# Patient Record
Sex: Male | Born: 1990
Health system: Southern US, Community
[De-identification: ages and names within clinical notes are randomized; demographics above are authoritative.]

## PROBLEM LIST (undated history)

## (undated) DIAGNOSIS — T7840XA Allergy, unspecified, initial encounter: Secondary | ICD-10-CM

## (undated) HISTORY — DX: Allergy, unspecified, initial encounter: T78.40XA

---

## 2012-06-02 ENCOUNTER — Ambulatory Visit (INDEPENDENT_AMBULATORY_CARE_PROVIDER_SITE_OTHER): Payer: BC Managed Care – PPO | Admitting: Family Medicine

## 2012-06-02 DIAGNOSIS — Z23 Encounter for immunization: Secondary | ICD-10-CM

## 2012-12-08 ENCOUNTER — Ambulatory Visit (INDEPENDENT_AMBULATORY_CARE_PROVIDER_SITE_OTHER): Payer: BC Managed Care – PPO | Admitting: Family Medicine

## 2012-12-08 VITALS — BP 94/68 | HR 63 | Temp 97.8°F | Resp 16 | Ht 64.0 in | Wt 116.0 lb

## 2012-12-08 DIAGNOSIS — J302 Other seasonal allergic rhinitis: Secondary | ICD-10-CM

## 2012-12-08 DIAGNOSIS — Z0000 Encounter for general adult medical examination without abnormal findings: Secondary | ICD-10-CM

## 2012-12-08 LAB — POCT CBC
Granulocyte percent: 54.9 %G (ref 37–80)
HCT, POC: 50.2 % (ref 43.5–53.7)
MCHC: 32.3 g/dL (ref 31.8–35.4)
MPV: 7.9 fL (ref 0–99.8)
POC Granulocyte: 3.8 (ref 2–6.9)
POC LYMPH PERCENT: 35.9 %L (ref 10–50)
POC MID %: 9.2 %M (ref 0–12)
Platelet Count, POC: 348 10*3/uL (ref 142–424)
RDW, POC: 13 %

## 2012-12-08 LAB — LIPID PANEL
Cholesterol: 201 mg/dL — ABNORMAL HIGH (ref 0–200)
Triglycerides: 187 mg/dL — ABNORMAL HIGH (ref <150)
VLDL: 37 mg/dL (ref 0–40)

## 2012-12-08 LAB — COMPREHENSIVE METABOLIC PANEL
Albumin: 5 g/dL (ref 3.5–5.2)
BUN: 16 mg/dL (ref 6–23)
CO2: 27 mEq/L (ref 19–32)
Calcium: 10.2 mg/dL (ref 8.4–10.5)
Chloride: 103 mEq/L (ref 96–112)
Glucose, Bld: 90 mg/dL (ref 70–99)
Potassium: 4.8 mEq/L (ref 3.5–5.3)
Sodium: 139 mEq/L (ref 135–145)
Total Protein: 7.8 g/dL (ref 6.0–8.3)

## 2012-12-08 MED ORDER — CETIRIZINE HCL 10 MG PO TABS: 10.0000 mg | ORAL_TABLET | Freq: Every day | ORAL | Status: DC

## 2012-12-08 MED ORDER — FLUTICASONE PROPIONATE 50 MCG/ACT NA SUSP: 2.0000 | Freq: Every day | NASAL | Status: DC

## 2012-12-08 NOTE — Patient Instructions (Signed)

## 2012-12-08 NOTE — Progress Notes (Signed)
  Subjective:    Patient ID: Randy Roberts, male    DOB: Apr 17, 1991, 22 y.o.   MRN: [id number]  HPI  22 yo moved here [place] 2 yrs ago.   Had all his immunizations UTD in 2011 when he got his visa to come here inc MMR.  Review of Systems    BP 94/68  Pulse 63  Temp(Src) 97.8 F (36.6 C) (Oral)  Resp 16  Ht 5\' 4"  (1.626 m)  Wt 116 lb (52.617 kg)  BMI 19.9 kg/m2  SpO2 100% Objective:   Physical Exam        Assessment & Plan:

## 2012-12-13 ENCOUNTER — Telehealth: Payer: Self-pay

## 2012-12-13 NOTE — Telephone Encounter (Signed)
See labs 

## 2012-12-13 NOTE — Telephone Encounter (Signed)
Randy Roberts (mother's husband) calling about lab results. States they received 2 messages, one for the mother and one for her son. They were seen on the same day. Cb# [id number].

## 2013-03-15 ENCOUNTER — Ambulatory Visit (INDEPENDENT_AMBULATORY_CARE_PROVIDER_SITE_OTHER): Payer: BC Managed Care – PPO | Admitting: Family Medicine

## 2013-03-15 VITALS — BP 100/58 | HR 62 | Temp 98.2°F | Resp 16 | Ht 64.0 in | Wt 110.8 lb

## 2013-03-15 DIAGNOSIS — J0100 Acute maxillary sinusitis, unspecified: Secondary | ICD-10-CM

## 2013-03-15 DIAGNOSIS — J069 Acute upper respiratory infection, unspecified: Secondary | ICD-10-CM

## 2013-03-15 DIAGNOSIS — J309 Allergic rhinitis, unspecified: Secondary | ICD-10-CM

## 2013-03-15 DIAGNOSIS — G4700 Insomnia, unspecified: Secondary | ICD-10-CM

## 2013-03-15 MED ORDER — AZITHROMYCIN 250 MG PO TABS: ORAL_TABLET | ORAL | Status: DC

## 2013-03-15 NOTE — Patient Instructions (Addendum)
1.  Restart Zyrtec (one tablet daily) and Flonase nasal spray (2 sprays each nostril daily). 2. Take nighttime cold medication every night. 3.  Start Zpack daily. 4.  Stay out of work on Monday and Tuesday.

## 2013-03-15 NOTE — Progress Notes (Signed)
[address]   [place], [place]  [address]   [phone]  Subjective:    Patient ID: Randy Roberts, male    DOB: 1990/09/05, 22 y.o.   MRN: [id number]  HPI This 22 y.o. male presents for evaluation of sinus congestion.  Flew from [place] to [place] on 03/10/13.  +dizziness.  +nasal congestion.  Bought Dayquil and Nyquil for three days.  Plans to return to work tomorrow.  Did not sleep well due to time change; stayed up all night.  Malaise.  Would like two days off from work.  Low grade fever upon return.  No longer having fever.  +rhinorrhea.  No ear pain.  No sore throat. No cough.  +decreased appetite; no n/v/d.  Took MVI this morning.  Takes two fish oil.  No longer taking Zyrtec or Flonase; took both for one month.    Review of Systems  Constitutional: Negative for fever, chills, diaphoresis and fatigue.  HENT: Positive for congestion, rhinorrhea, sneezing, postnasal drip and sinus pressure. Negative for ear pain, sore throat, trouble swallowing and voice change.   Respiratory: Negative for cough.   Gastrointestinal: Negative for nausea, vomiting, abdominal pain and diarrhea.  Skin: Negative for rash.  Neurological: Negative for dizziness, light-headedness and headaches.   Past Medical History  Diagnosis Date  . Allergy    Current Outpatient Prescriptions on File Prior to Visit  Medication Sig Dispense Refill  . cetirizine (ZYRTEC) 10 MG tablet Take 1 tablet (10 mg total) by mouth daily.  30 tablet  11  . fish oil-omega-3 fatty acids 1000 MG capsule Take 2 g by mouth daily.      . fluticasone (FLONASE) 50 MCG/ACT nasal spray Place 2 sprays into the nose at bedtime.  16 g  6  . Multiple Vitamins-Minerals (MULTIVITAMIN WITH MINERALS) tablet Take 1 tablet by mouth daily.       No current facility-administered medications on file prior to visit.       Objective:   Physical Exam  Nursing note and vitals reviewed. Constitutional: He appears well-developed and well-nourished. No distress.    HENT:  Head: Normocephalic and atraumatic.  Right Ear: External ear normal.  Left Ear: External ear normal.  Nose: Mucosal edema and rhinorrhea present. Right sinus exhibits maxillary sinus tenderness. Right sinus exhibits no frontal sinus tenderness. Left sinus exhibits maxillary sinus tenderness. Left sinus exhibits no frontal sinus tenderness.  Mouth/Throat: Oropharynx is clear and moist.  Eyes: Conjunctivae are normal. Pupils are equal, round, and reactive to light.  Neck: Normal range of motion. Neck supple.  Cardiovascular: Normal rate, regular rhythm and normal heart sounds.   Pulmonary/Chest: Effort normal and breath sounds normal.  Lymphadenopathy:    He has no cervical adenopathy.  Skin: Skin is warm and dry. No rash noted. He is not diaphoretic.  Psychiatric: He has a normal mood and affect. His behavior is normal.       Assessment & Plan:  Acute upper respiratory infections of unspecified site  Allergic rhinitis, cause unspecified  Sinusitis, acute maxillary - Plan: azithromycin (ZITHROMAX) 250 MG tablet  Insomnia   1.  Allergic Rhinitis:  New/worsening; versus URI.  Restart Zyrtec and Flonase; continue Nyquil qhs.   2.  Acute sinusitis maxillary: New.  Rx for Zpack provided. 3.  Insomnia: New. Due to recent return from [place]; OOW note for two days.  Meds ordered this encounter  Medications  . azithromycin (ZITHROMAX) 250 MG tablet    Sig: Take 2 tabs PO x  1 dose, then 1 tab PO QD x 4 days    Dispense:  6 tablet    Refill:  0

## 2013-05-26 ENCOUNTER — Ambulatory Visit (INDEPENDENT_AMBULATORY_CARE_PROVIDER_SITE_OTHER): Payer: BC Managed Care – PPO | Admitting: Family Medicine

## 2013-05-26 DIAGNOSIS — Z23 Encounter for immunization: Secondary | ICD-10-CM

## 2014-01-19 ENCOUNTER — Ambulatory Visit (INDEPENDENT_AMBULATORY_CARE_PROVIDER_SITE_OTHER): Payer: BC Managed Care – PPO | Admitting: Family Medicine

## 2014-01-19 VITALS — BP 122/86 | HR 65 | Temp 98.4°F | Resp 18 | Ht 63.0 in | Wt 113.2 lb

## 2014-01-19 DIAGNOSIS — Z0000 Encounter for general adult medical examination without abnormal findings: Secondary | ICD-10-CM

## 2014-01-19 NOTE — Progress Notes (Signed)
Subjective:    Patient ID: Randy Roberts, male    DOB: [birth year], 23 y.o.   MRN: [id number]  HPI Randy Roberts is a 23 y.o. male Here for annual exam.   immunizations UTD in 2011 when he got his visa to come here including MMR. No specific concerns today.   No rx meds, no medical problems. Prior allergy medicine - not needed now.   No hospitalizations. Last dentist visit six months ago. Last eye visit was a year ago. His wife lives in [place]. No other physical relations. No risk of STI. No concerns with his body. No rash. Mild acne. Pt agrees he does not need blood work this year.    SH: nonsmoker, no alcohol.      Last CPE in 11/2012: Results for orders placed in visit on 12/08/12  LIPID PANEL      Result Value Ref Range   Cholesterol 201 (*) 0 - 200 mg/dL   Triglycerides 187 (*) <150 mg/dL   HDL 48  >39 mg/dL   Total CHOL/HDL Ratio 4.2     VLDL 37  0 - 40 mg/dL   LDL Cholesterol 116 (*) 0 - 99 mg/dL  TSH      Result Value Ref Range   TSH 1.096  0.350 - 4.500 uIU/mL  COMPREHENSIVE METABOLIC PANEL      Result Value Ref Range   Sodium 139  135 - 145 mEq/L   Potassium 4.8  3.5 - 5.3 mEq/L   Chloride 103  96 - 112 mEq/L   CO2 27  19 - 32 mEq/L   Glucose, Bld 90  70 - 99 mg/dL   BUN 16  6 - 23 mg/dL   Creat 0.91  0.50 - 1.35 mg/dL   Total Bilirubin 0.8  0.3 - 1.2 mg/dL   Alkaline Phosphatase 61  39 - 117 U/L   AST 18  0 - 37 U/L   ALT 34  0 - 53 U/L   Total Protein 7.8  6.0 - 8.3 g/dL   Albumin 5.0  3.5 - 5.2 g/dL   Calcium 10.2  8.4 - 10.5 mg/dL  POCT CBC      Result Value Ref Range   WBC 6.9  4.6 - 10.2 K/uL   Lymph, poc 2.5  0.6 - 3.4   POC LYMPH PERCENT 35.9  10 - 50 %L   MID (cbc) 0.6  0 - 0.9   POC MID % 9.2  0 - 12 %M   POC Granulocyte 3.8  2 - 6.9   Granulocyte percent 54.9  37 - 80 %G   RBC 5.54  4.69 - 6.13 M/uL   Hemoglobin 16.2  14.1 - 18.1 g/dL   HCT, POC 50.2  43.5 - 53.7 %   MCV 90.7  80 - 97 fL   MCH, POC 29.2  27 - 31.2 pg   MCHC 32.3  31.8 -  35.4 g/dL   RDW, POC 13.0     Platelet Count, POC 348  142 - 424 K/uL   MPV 7.9  0 - 99.8 fL     There are no active problems to display for this patient.  Past Medical History  Diagnosis Date  . Allergy    History reviewed. No pertinent past surgical history. Allergies  Allergen Reactions  . Penicillins Itching  . Tetracyclines & Related Itching   Prior to Admission medications   Medication Sig Start Date End Date Taking? Authorizing Provider  Multiple Vitamins-Minerals (MULTIVITAMIN  WITH MINERALS) tablet Take 1 tablet by mouth daily.   Yes Historical Provider, MD  azithromycin (ZITHROMAX) 250 MG tablet Take 2 tabs PO x 1 dose, then 1 tab PO QD x 4 days 03/15/13   Wardell Honour, MD  cetirizine (ZYRTEC) 10 MG tablet Take 1 tablet (10 mg total) by mouth daily. 12/08/12   Shawnee Knapp, MD  fish oil-omega-3 fatty acids 1000 MG capsule Take 2 g by mouth daily.    Historical Provider, MD  fluticasone (FLONASE) 50 MCG/ACT nasal spray Place 2 sprays into the nose at bedtime. 12/08/12   Shawnee Knapp, MD   History   Social History  . Marital Status: Single    Spouse Name: N/A    Number of Children: N/A  . Years of Education: N/A   Occupational History  . Not on file.   Social History Main Topics  . Smoking status: Never Smoker   . Smokeless tobacco: Never Used  . Alcohol Use: No  . Drug Use: No  . Sexual Activity: No   Other Topics Concern  . Not on file   Social History Narrative  . No narrative on file       Review of Systems 13 point review of systems per patient health survey noted.  Negative other than as indicated above.      Objective:   Physical Exam  Vitals reviewed. Constitutional: He is oriented to person, place, and time. He appears well-developed and well-nourished.  HENT:  Head: Normocephalic and atraumatic.  Right Ear: External ear normal.  Left Ear: External ear normal.  Mouth/Throat: Oropharynx is clear and moist.  Eyes: Conjunctivae and EOM are  normal. Pupils are equal, round, and reactive to light.  Neck: Normal range of motion. Neck supple. No thyromegaly present.  Cardiovascular: Normal rate, regular rhythm, normal heart sounds and intact distal pulses.   Pulmonary/Chest: Effort normal and breath sounds normal. No respiratory distress. He has no wheezes.  Abdominal: Soft. He exhibits no distension. There is no tenderness.  Musculoskeletal: Normal range of motion. He exhibits no edema and no tenderness.  Lymphadenopathy:    He has no cervical adenopathy.  Neurological: He is alert and oriented to person, place, and time. He has normal reflexes.  Skin: Skin is warm and dry.     Psychiatric: He has a normal mood and affect. His behavior is normal.   Filed Vitals:   01/19/14 0857  BP: 122/86  Pulse: 65  Temp: 98.4 F (36.9 C)  TempSrc: Oral  Resp: 18  Height: _0  (1.6 m)  Weight: 113 lb 3.2 oz (51.347 kg)  SpO2: 100%    Visual Acuity Screening   Right eye Left eye Both eyes  Without correction:     With correction: 20/25-1 2025-1 20/20-1      Assessment & Plan:  Randy Roberts is a 23 y.o. male No diagnosis found.  Annual exam, no concerns identified or high risk behaviors. Few areas of acne L forehead, handout given, discussed otc treatments. rtc precautions.   Discussed no need for bloodwork today given age and only mildly elevated chol last year.    No orders of the defined types were placed in this encounter.   Patient Instructions  For bumps on face (Acne) try Cetaphil cleanser or Neutrogena cleanser each day (cleanser with benzoyl peroxide can help with acne). See other information below.  No blood tests needed today, return if you have any concerns or questions.   M?n tr?ng  c (Acne) M?n tr?ng c l v?n ?? v? da gy n?i m?n. M?n tr?ng c xu?t hi?n khi cc l? chn lng trn da b? t?c. L? chn lng c th? tr? nn t?y ??, ?au v s?ng (vim) ho?c b? nhi?m m?t lo?i vi khu?n da ph? bi?n (Propionibacterium  acnes). M?n tr?ng c l v?n ?? ph? bi?n ? da. C ??n 80% ng??i b? m?n tr?ng c t?i m?t th?i ?i?m no ?Marland Kitchen M?n tr?ng c ??c bi?t ph? bi?n ? ?? tu?i t? 10 ??n 22. M?n tr?ng c th??ng h?t theo th?i gian nh? ?i?u tr? thch h?p.  NGUYN NHN L? chn lng c ch?a tuy?n b nh?n. Tuy?n b nh?n t?o ra ch?t nh?n ???c g?i l b nh?n.M?n tr?ng c x?y ra khi cc tuy?n ny b? t?c do b nh?n, cc t? bo da ch?t v b?i b?n. Vi khu?n P.acnes c th??ng ???c tm th?y trong tuy?n b nh?n sau ? sinh si n?y n?, gy ra vim. M?n tr?ng c th??ng b?t ??u b?i nh?ng thay ??i hocmon. Nh?ng thay ??i hocmon ny c th? lm cho tuy?n b nh?n tr? nn to h?n v t?o ra nhi?u b nh?n h?n. Cc y?u t? c th? lm cho m?n tr?ng c tr?m tr?ng h?n bao g?m:  Thay ??i hocmon trong th?i k thanh nin.  Thay ??i hc-mn trong chu k? kinh nguy?t c?a ph? n?.  Thay ??i hocmon trong th?i k mang [place].  M? ph?m v s?n ph?m tc c d?u.  Ch xt da qu m?nh.  X phng m?nh.  C?ng th?ng.  V?n ?? hc-mn do m?t s? b?nh nh?t ??nh.  Tc di ho?c c nhi?u d?u c? vo da.  M?t s? lo?i thu?c nh?t ??nh.  p l?c t? b?ng bu?c ??u, ba l, ho?c ??m vai.  Ti?p xc v?i m?t s? lo?i d?u v ha ch?t. TRI?U CH?NG M?n tr?ng c th??ng xu?t hi?n trn m?t, c?, ng?c v ph?n l?ng pha trn. Cc tri?u ch?ng bao g?m:  U nh?, mu ?? (n?i m?n ho?c n?t s?n).  M?n ??u tr?ng (m?n tr?ng c b?c).  M?n ??u ?en (m?n tr?ng c m?).  M?n nh?, ch?a ??y m? (m?n m?).  M?n to, mu ?? ho?c m?n m? s? vo th?y m?m. M?n tr?ng c n?ng h?n c th? gy ra:  Vng b? nhi?m b?nh c ch?a m?t l??ng m? (p xe).  B?ng c?ng, ?au, ch?a ??y d?ch (nang).  S?o. CH?N ?ON Chuyn gia ch?m [place] s?c kh?e c?a b?n th??ng c th? xc ??nh v?n ?? b?ng cch khm th?c th?. ?I?U TR? C nhi?u ph??ng php ?i?u tr? t?t cho m?n tr?ng c. M?t s? lo?i c th? s? d?ng ???c d??i d?ng khng c?n k ??n, m?t s? c th? s? d?ng ???c d??i d?ng c?n ph?i k ??n. Cch ?i?u tr? t?t nh?t cho b?n ph? thu?c vo  lo?i m?n tr?ng c b?n b? v m?c ?? nghim tr?ng c?a n. Vi?c ?i?u tr? c th? m?t 2 thng tr??c khi b?nh m?n tr?ng c thuyn gi?m. Cc ph??ng php ?i?u tr? thng d?ng bao g?m:  Kem v dung d?ch ng?n ng?a t?c tuy?n b nh?n.  Kem v dung d?ch ?i?u tr? ho?c ng?n ng?a nhi?m trng v vim.  [place] ho?c dng d??i d?ng vin.  Thu?c gi?m s? s?n sinh b nh?n.  Thu?c trnh [place].  ?i?u tr? b?ng ?n chi?u sng ho?c laze ??c bi?t.  Ti?u ph?u.  Tim thu?c vo nh?ng vng m?n tr?ng  c.  Ha ch?t lm [place]. H??NG D?N CH?M [place] T?I NH Ch?m [place] da t?t l ph?n quan tr?ng nh?t c?a qu trnh ?i?u tr?Marland Kitchen  R?a da nh? nhng t nh?t hai l?n m?i ngy v sau khi t?p th? d?c. [place]??c khi ?i ng?.  S? d?ng x phng nh?Trina Ao kem gi? ?m c n??c sau m?i l?n r?a.  Gi? tc s?ch v khng ?? tc c? ln m?t. G?i ??u hng ngy.  Ch? s? d?ng thu?c theo ch? d?n c?a chuyn gia ch?m [place] s?c kh?e.  S? d?ng kem ho?c dung d?ch ch?ng n?ng v?i ch? s? SPF 56 ho?c cao h?n. ?i?u ny ??c bi?t quan tr?ng khi b?n s? d?ng thu?c tr? m?n tr?ng c.  Ch?n m? ph?m khng lm t?c l? chn lng. ?i?u ny c ngh?a l chng khng lm t?c tuy?n b nh?n.  Trnh ch?ng tay ln c?m ho?c trn.  Trnh ?eo b?ng bu?c ??u ho?c ??i m? ch?t.  Trnh bp ho?c n?n m?n. ?i?u ny c th? lm cho tnh tr?ng m?n tr?m tr?ng h?n v gy ra s?o. HY ?I KHM N?U:  M?n tr?ng c khng thuyn gi?m sau 8 tu?n.  M?n tr?ng c tr? nn t?i t? h?n.  M?t vng l?n c?a da t?y ?? ho?c nh?y c?m ?au. Document Released: 07/30/2005 Document Revised: 04/01/2013 [facility] Patient Information 2014 [place], [place].   Keeping you healthy  Get these tests  Blood pressure- Have your blood pressure checked once a year by your healthcare provider.  Normal blood pressure is 120/80.  Weight- Have your body mass index (BMI) calculated to screen for obesity.  BMI is a measure of body fat based on height and weight. You can also calculate your own BMI at  GravelBags.it.  Cholesterol- Have your cholesterol checked regularly starting at age 46, sooner may be necessary if you have diabetes, high blood pressure, if a family member developed heart diseases at an early age or if you smoke.   Chlamydia, HIV, and other sexual transmitted disease- Get screened each year until the age of 53 then within three months of each new sexual partner.  Diabetes- Have your blood sugar checked regularly if you have high blood pressure, high cholesterol, a family history of diabetes or if you are overweight.  Get these vaccines  Flu shot- Every fall.  Tetanus shot- Every 10 years.  Menactra- Single dose; prevents meningitis.  Take these steps  Don't smoke- If you do smoke, ask your healthcare provider about quitting. For tips on how to quit, go to www.smokefree.gov or call 1-800-QUIT-NOW.  Be physically active- Exercise 5 days a week for at least 30 minutes.  If you are not already physically active start slow and gradually work up to 30 minutes of moderate physical activity.  Examples of moderate activity include walking briskly, mowing the yard, dancing, swimming bicycling, etc.  Eat a healthy diet- Eat a variety of healthy foods such as fruits, vegetables, low fat milk, low fat cheese, yogurt, lean meats, poultry, fish, beans, tofu, etc.  For more information on healthy eating, go to www.thenutritionsource.org  Drink alcohol in moderation- Limit alcohol intake two drinks or less a day.  Never drink and drive.  Dentist- Brush and floss teeth twice daily; visit your dentis twice a year.  Depression-Your emotional health is as important as your physical health.  If you're feeling down, losing interest in things you normally enjoy please talk with your healthcare provider.  Gun Safety- If  you keep a gun in your home, keep it unloaded and with the safety lock on.  Bullets should be stored separately.  Helmet use- Always wear a helmet when riding a  motorcycle, bicycle, rollerblading or skateboarding.  Safe sex- If you may be exposed to a sexually transmitted infection, use a condom  Seat belts- Seat bels can save your life; always wear one.  Smoke/Carbon Monoxide detectors- These detectors need to be installed on the appropriate level of your home.  Replace batteries at least once a year.  Skin Cancer- When out in the sun, cover up and use sunscreen SPF 15 or higher.  Violence- If anyone is threatening or hurting you, please tell your healthcare provider.

## 2014-01-19 NOTE — Patient Instructions (Signed)
For bumps on face (Acne) try Cetaphil cleanser or Neutrogena cleanser each day (cleanser with benzoyl peroxide can help with acne). See other information below.  No blood tests needed today, return if you have any concerns or questions.   M?n tr?ng c (Acne) M?n tr?ng c l v?n ?? v? da gy n?i m?n. M?n tr?ng c xu?t hi?n khi cc l? chn lng trn da b? t?c. L? chn lng c th? tr? nn t?y ??, ?au v s?ng (vim) ho?c b? nhi?m m?t lo?i vi khu?n da ph? bi?n (Propionibacterium acnes). M?n tr?ng c l v?n ?? ph? bi?n ? da. C ??n 80% ng??i b? m?n tr?ng c t?i m?t th?i ?i?m no ?Marland Kitchen M?n tr?ng c ??c bi?t ph? bi?n ? ?? tu?i t? 10 ??n 22. M?n tr?ng c th??ng h?t theo th?i gian nh? ?i?u tr? thch h?p.  NGUYN NHN L? chn lng c ch?a tuy?n b nh?n. Tuy?n b nh?n t?o ra ch?t nh?n ???c g?i l b nh?n.M?n tr?ng c x?y ra khi cc tuy?n ny b? t?c do b nh?n, cc t? bo da ch?t v b?i b?n. Vi khu?n P.acnes c th??ng ???c tm th?y trong tuy?n b nh?n sau ? sinh si n?y n?, gy ra vim. M?n tr?ng c th??ng b?t ??u b?i nh?ng thay ??i hocmon. Nh?ng thay ??i hocmon ny c th? lm cho tuy?n b nh?n tr? nn to h?n v t?o ra nhi?u b nh?n h?n. Cc y?u t? c th? lm cho m?n tr?ng c tr?m tr?ng h?n bao g?m:  Thay ??i hocmon trong th?i k thanh nin.  Thay ??i hc-mn trong chu k? kinh nguy?t c?a ph? n?.  Thay ??i hocmon trong th?i k mang [place].  M? ph?m v s?n ph?m tc c d?u.  Ch xt da qu m?nh.  X phng m?nh.  C?ng th?ng.  V?n ?? hc-mn do m?t s? b?nh nh?t ??nh.  Tc di ho?c c nhi?u d?u c? vo da.  M?t s? lo?i thu?c nh?t ??nh.  p l?c t? b?ng bu?c ??u, ba l, ho?c ??m vai.  Ti?p xc v?i m?t s? lo?i d?u v ha ch?t. TRI?U CH?NG M?n tr?ng c th??ng xu?t hi?n trn m?t, c?, ng?c v ph?n l?ng pha trn. Cc tri?u ch?ng bao g?m:  U nh?, mu ?? (n?i m?n ho?c n?t s?n).  M?n ??u tr?ng (m?n tr?ng c b?c).  M?n ??u ?en (m?n tr?ng c m?).  M?n nh?, ch?a ??y m? (m?n m?).  M?n to, mu ?? ho?c m?n m?  s? vo th?y m?m. M?n tr?ng c n?ng h?n c th? gy ra:  Vng b? nhi?m b?nh c ch?a m?t l??ng m? (p xe).  B?ng c?ng, ?au, ch?a ??y d?ch (nang).  S?o. CH?N ?ON Chuyn gia ch?m [place] s?c kh?e c?a b?n th??ng c th? xc ??nh v?n ?? b?ng cch khm th?c th?. ?I?U TR? C nhi?u ph??ng php ?i?u tr? t?t cho m?n tr?ng c. M?t s? lo?i c th? s? d?ng ???c d??i d?ng khng c?n k ??n, m?t s? c th? s? d?ng ???c d??i d?ng c?n ph?i k ??n. Cch ?i?u tr? t?t nh?t cho b?n ph? thu?c vo lo?i m?n tr?ng c b?n b? v m?c ?? nghim tr?ng c?a n. Vi?c ?i?u tr? c th? m?t 2 thng tr??c khi b?nh m?n tr?ng c thuyn gi?m. Cc ph??ng php ?i?u tr? thng d?ng bao g?m:  Kem v dung d?ch ng?n ng?a t?c tuy?n b nh?n.  Kem v dung d?ch ?i?u tr? ho?c ng?n ng?a nhi?m trng v vim.  Khng sinh  thoa ln da ho?c dng d??i d?ng vin.  Thu?c gi?m s? s?n sinh b nh?n.  Thu?c trnh [place].  ?i?u tr? b?ng ?n chi?u sng ho?c laze ??c bi?t.  Ti?u ph?u.  Tim thu?c vo nh?ng vng m?n tr?ng c.  Ha ch?t lm l?t da. H??NG D?N CH?M [place] T?I NH Ch?m [place] da t?t l ph?n quan tr?ng nh?t c?a qu trnh ?i?u tr?Marland Kitchen.  R?a da nh? nhng t nh?t hai l?n m?i ngy v sau khi t?p th? d?c. Lun r?a da tr??c khi ?i ng?.  S? d?ng x phng nh?Chad Cordial.  Thoa kem gi? ?m c n??c sau m?i l?n r?a.  Gi? tc s?ch v khng ?? tc c? ln m?t. G?i ??u hng ngy.  Ch? s? d?ng thu?c theo ch? d?n c?a chuyn gia ch?m [place] s?c kh?e.  S? d?ng kem ho?c dung d?ch ch?ng n?ng v?i ch? s? SPF 30 ho?c cao h?n. ?i?u ny ??c bi?t quan tr?ng khi b?n s? d?ng thu?c tr? m?n tr?ng c.  Ch?n m? ph?m khng lm t?c l? chn lng. ?i?u ny c ngh?a l chng khng lm t?c tuy?n b nh?n.  Trnh ch?ng tay ln c?m ho?c trn.  Trnh ?eo b?ng bu?c ??u ho?c ??i m? ch?t.  Trnh bp ho?c n?n m?n. ?i?u ny c th? lm cho tnh tr?ng m?n tr?m tr?ng h?n v gy ra s?o. HY ?I KHM N?U:  M?n tr?ng c khng thuyn gi?m sau 8 tu?n.  M?n tr?ng c tr? nn t?i t? h?n.  M?t vng l?n c?a da  t?y ?? ho?c nh?y c?m ?au. Document Released: 07/30/2005 Document Revised: 04/01/2013 [facility] Patient Information 2014 [place].   Keeping you healthy  Get these tests  Blood pressure- Have your blood pressure checked once a year by your healthcare provider.  Normal blood pressure is 120/80.  Weight- Have your body mass index (BMI) calculated to screen for obesity.  BMI is a measure of body fat based on height and weight. You can also calculate your own BMI at https://www.west-esparza.com/www.nhlbisupport.com/bmi/.  Cholesterol- Have your cholesterol checked regularly starting at age 25, sooner may be necessary if you have diabetes, high blood pressure, if a family member developed heart diseases at an early age or if you smoke.   Chlamydia, HIV, and other sexual transmitted disease- Get screened each year until the age of 825 then within three months of each new sexual partner.  Diabetes- Have your blood sugar checked regularly if you have high blood pressure, high cholesterol, a family history of diabetes or if you are overweight.  Get these vaccines  Flu shot- Every fall.  Tetanus shot- Every 10 years.  Menactra- Single dose; prevents meningitis.  Take these steps  Don't smoke- If you do smoke, ask your healthcare provider about quitting. For tips on how to quit, go to www.smokefree.gov or call 1-800-QUIT-NOW.  Be physically active- Exercise 5 days a week for at least 30 minutes.  If you are not already physically active start slow and gradually work up to 30 minutes of moderate physical activity.  Examples of moderate activity include walking briskly, mowing the yard, dancing, swimming bicycling, etc.  Eat a healthy diet- Eat a variety of healthy foods such as fruits, vegetables, low fat milk, low fat cheese, yogurt, lean meats, poultry, fish, beans, tofu, etc.  For more information on healthy eating, go to www.thenutritionsource.org  Drink alcohol in moderation- Limit alcohol intake two drinks or  less a day.  Never drink and drive.  Dentist- Brush and  floss teeth twice daily; visit your dentis twice a year.  Depression-Your emotional health is as important as your physical health.  If you're feeling down, losing interest in things you normally enjoy please talk with your healthcare provider.  Gun Safety- If you keep a gun in your home, keep it unloaded and with the safety lock on.  Bullets should be stored separately.  Helmet use- Always wear a helmet when riding a motorcycle, bicycle, rollerblading or skateboarding.  Safe sex- If you may be exposed to a sexually transmitted infection, use a condom  Seat belts- Seat bels can save your life; always wear one.  Smoke/Carbon Monoxide detectors- These detectors need to be installed on the appropriate level of your home.  Replace batteries at least once a year.  Skin Cancer- When out in the sun, cover up and use sunscreen SPF 15 or higher.  Violence- If anyone is threatening or hurting you, please tell your healthcare provider.

## 2014-06-01 ENCOUNTER — Ambulatory Visit (INDEPENDENT_AMBULATORY_CARE_PROVIDER_SITE_OTHER): Payer: BC Managed Care – PPO | Admitting: Radiology

## 2014-06-01 DIAGNOSIS — Z23 Encounter for immunization: Secondary | ICD-10-CM

## 2015-03-28 ENCOUNTER — Encounter (HOSPITAL_COMMUNITY): Payer: Self-pay

## 2015-03-28 ENCOUNTER — Emergency Department (HOSPITAL_COMMUNITY)
Admission: EM | Admit: 2015-03-28 | Discharge: 2015-03-28 | Disposition: A | Discharge status: Home / Self Care | Payer: BLUE CROSS/BLUE SHIELD | Attending: Emergency Medicine | Admitting: Emergency Medicine

## 2015-03-28 DIAGNOSIS — Z79899 Other long term (current) drug therapy: Secondary | ICD-10-CM | POA: Insufficient documentation

## 2015-03-28 DIAGNOSIS — Y998 Other external cause status: Secondary | ICD-10-CM | POA: Diagnosis not present

## 2015-03-28 DIAGNOSIS — Y92410 Unspecified street and highway as the place of occurrence of the external cause: Secondary | ICD-10-CM | POA: Diagnosis not present

## 2015-03-28 DIAGNOSIS — R110 Nausea: Secondary | ICD-10-CM | POA: Diagnosis not present

## 2015-03-28 DIAGNOSIS — R42 Dizziness and giddiness: Secondary | ICD-10-CM | POA: Insufficient documentation

## 2015-03-28 DIAGNOSIS — Z7951 Long term (current) use of inhaled steroids: Secondary | ICD-10-CM | POA: Insufficient documentation

## 2015-03-28 DIAGNOSIS — Y9389 Activity, other specified: Secondary | ICD-10-CM | POA: Insufficient documentation

## 2015-03-28 DIAGNOSIS — Z880 Allergy status to penicillin: Secondary | ICD-10-CM | POA: Diagnosis not present

## 2015-03-28 DIAGNOSIS — Z041 Encounter for examination and observation following transport accident: Secondary | ICD-10-CM | POA: Insufficient documentation

## 2015-03-28 MED ORDER — MECLIZINE HCL 25 MG PO TABS
25.0000 mg | ORAL_TABLET | Freq: Once | ORAL | Status: AC
  Administered 2015-03-28: 25 mg via ORAL
  Filled 2015-03-28: qty 1

## 2015-03-28 MED ORDER — MECLIZINE HCL 25 MG PO TABS: 25.0000 mg | ORAL_TABLET | Freq: Three times a day (TID) | ORAL | Status: DC | PRN

## 2015-03-28 NOTE — Discharge Instructions (Signed)
1. Medications: Meclizine, usual home medications 2. Treatment: rest, drink plenty of fluids,  3. Follow Up: Please followup with your primary doctor in 2 days and ENT in 1 week for discussion of your diagnoses and further evaluation after today's visit; if you do not have a primary care doctor use the resource guide provided to find one; Please return to the ER for worsening symptoms, development of headache, visual changes, difficulty walking, numbness or tingling in any extremity     Benign Positional Vertigo Vertigo means you feel like you or your surroundings are moving when they are not. Benign positional vertigo is the most common form of vertigo. Benign means that the cause of your condition is not serious. Benign positional vertigo is more common in older adults. CAUSES  Benign positional vertigo is the result of an upset in the labyrinth system. This is an area in the middle ear that helps control your balance. This may be caused by a viral infection, head injury, or repetitive motion. However, often no specific cause is found. SYMPTOMS  Symptoms of benign positional vertigo occur when you move your head or eyes in different directions. Some of the symptoms may include:  Loss of balance and falls.  Vomiting.  Blurred vision.  Dizziness.  Nausea.  Involuntary eye movements (nystagmus). DIAGNOSIS  Benign positional vertigo is usually diagnosed by physical exam. If the specific cause of your benign positional vertigo is unknown, your caregiver may perform imaging tests, such as magnetic resonance imaging (MRI) or computed tomography (CT). TREATMENT  Your caregiver may recommend movements or procedures to correct the benign positional vertigo. Medicines such as meclizine, benzodiazepines, and medicines for nausea may be used to treat your symptoms. In rare cases, if your symptoms are caused by certain conditions that affect the inner ear, you may need surgery. HOME CARE  INSTRUCTIONS   Follow your caregiver's instructions.  Move slowly. Do not make sudden body or head movements.  Avoid driving.  Avoid operating heavy machinery.  Avoid performing any tasks that would be dangerous to you or others during a vertigo episode.  Drink enough fluids to keep your urine clear or pale yellow. SEEK IMMEDIATE MEDICAL CARE IF:   You develop problems with walking, weakness, numbness, or using your arms, hands, or legs.  You have difficulty speaking.  You develop severe headaches.  Your nausea or vomiting continues or gets worse.  You develop visual changes.  Your family or friends notice any behavioral changes.  Your condition gets worse.  You have a fever.  You develop a stiff neck or sensitivity to light. MAKE SURE YOU:   Understand these instructions.  Will watch your condition.  Will get help right away if you are not doing well or get worse. Document Released: 05/07/2006 Document Revised: 10/22/2011 Document Reviewed: 04/19/2011 [facility] Patient Information 2015 [place], [place]. This information is not intended to replace advice given to you by your health care provider. Make sure you discuss any questions you have with your health care provider.
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
## 2015-05-16 ENCOUNTER — Encounter: Payer: Self-pay | Admitting: Family Medicine

## 2015-05-16 ENCOUNTER — Ambulatory Visit (INDEPENDENT_AMBULATORY_CARE_PROVIDER_SITE_OTHER): Payer: BLUE CROSS/BLUE SHIELD | Admitting: Family Medicine

## 2015-05-16 VITALS — BP 100/60 | HR 68 | Temp 98.7°F | Resp 16 | Ht 63.5 in | Wt 113.0 lb

## 2015-05-16 DIAGNOSIS — Z131 Encounter for screening for diabetes mellitus: Secondary | ICD-10-CM

## 2015-05-16 DIAGNOSIS — Z23 Encounter for immunization: Secondary | ICD-10-CM

## 2015-05-16 DIAGNOSIS — Z13220 Encounter for screening for lipoid disorders: Secondary | ICD-10-CM | POA: Diagnosis not present

## 2015-05-16 DIAGNOSIS — Z0000 Encounter for general adult medical examination without abnormal findings: Secondary | ICD-10-CM

## 2015-05-16 DIAGNOSIS — Z130 Encounter for screening for diseases of the blood and blood-forming organs and certain disorders involving the immune mechanism: Secondary | ICD-10-CM | POA: Diagnosis not present

## 2015-05-16 DIAGNOSIS — Z119 Encounter for screening for infectious and parasitic diseases, unspecified: Secondary | ICD-10-CM | POA: Diagnosis not present

## 2015-05-16 LAB — RPR

## 2015-05-16 LAB — COMPREHENSIVE METABOLIC PANEL
ALK PHOS: 53 U/L (ref 40–115)
ALT: 28 U/L (ref 9–46)
AST: 19 U/L (ref 10–40)
Albumin: 4.8 g/dL (ref 3.6–5.1)
BILIRUBIN TOTAL: 1.2 mg/dL (ref 0.2–1.2)
BUN: 17 mg/dL (ref 7–25)
CALCIUM: 10.1 mg/dL (ref 8.6–10.3)
CO2: 26 mmol/L (ref 20–31)
Chloride: 103 mmol/L (ref 98–110)
Creat: 0.89 mg/dL (ref 0.60–1.35)
Glucose, Bld: 91 mg/dL (ref 65–99)
POTASSIUM: 4.3 mmol/L (ref 3.5–5.3)
Sodium: 139 mmol/L (ref 135–146)
TOTAL PROTEIN: 7.4 g/dL (ref 6.1–8.1)

## 2015-05-16 LAB — CBC
HEMATOCRIT: 46.2 % (ref 39.0–52.0)
Hemoglobin: 15.9 g/dL (ref 13.0–17.0)
MCH: 29.8 pg (ref 26.0–34.0)
MCHC: 34.4 g/dL (ref 30.0–36.0)
MCV: 86.7 fL (ref 78.0–100.0)
MPV: 8.7 fL (ref 8.6–12.4)
PLATELETS: 285 10*3/uL (ref 150–400)
RBC: 5.33 MIL/uL (ref 4.22–5.81)
RDW: 12.6 % (ref 11.5–15.5)
WBC: 5.8 10*3/uL (ref 4.0–10.5)

## 2015-05-16 LAB — LIPID PANEL
CHOL/HDL RATIO: 4.4 ratio (ref <=5.0)
CHOLESTEROL: 172 mg/dL (ref 125–200)
HDL: 39 mg/dL — ABNORMAL LOW (ref >=40)
LDL Cholesterol: 101 mg/dL (ref <130)
Triglycerides: 159 mg/dL — ABNORMAL HIGH (ref <150)
VLDL: 32 mg/dL — ABNORMAL HIGH (ref <30)

## 2015-05-16 LAB — HIV ANTIBODY (ROUTINE TESTING W REFLEX): HIV 1&2 Ab, 4th Generation: NONREACTIVE

## 2015-05-16 LAB — HEPATITIS B SURFACE ANTIGEN: Hepatitis B Surface Ag: NEGATIVE

## 2015-05-16 LAB — HEPATITIS B SURFACE ANTIBODY, QUANTITATIVE: HEPATITIS B-POST: 4.4 m[IU]/mL

## 2015-05-16 LAB — HEPATITIS C ANTIBODY: HCV AB: NEGATIVE

## 2015-05-16 NOTE — Progress Notes (Signed)
[facility] [address], [place] [address] [phone]  Date:  05/16/2015   Name:  Randy Roberts   DOB:  [birth year]   MRN:  [id number]  PCP:  No primary care provider on file.    Chief Complaint: Annual Exam and Flu Vaccine   History of Present Illness:  Randy Roberts is a 24 y.o. 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   Past Medical History  Diagnosis Date  . Allergy     History reviewed. No pertinent past surgical history.  Social History  Substance Use Topics  . Smoking status: Never Smoker   . Smokeless tobacco: Never Used  . Alcohol Use: No    History reviewed. No pertinent family history.  Allergies  Allergen Reactions  . Penicillins Itching  . Tetracyclines & Related Itching    Medication list has been reviewed and updated.  Current Outpatient Prescriptions on File Prior to Visit  Medication Sig Dispense Refill  . Multiple Vitamins-Minerals (MULTIVITAMIN WITH MINERALS) tablet Take 1 tablet by mouth daily.    . fluticasone (FLONASE) 50 MCG/ACT nasal spray Place 2 sprays into the nose at bedtime. (Patient not taking: Reported on 03/28/2015) 16 g 6   No current facility-administered medications on file prior to visit.    Review of Systems:  As per HPI- otherwise negative.   Physical Examination: Filed Vitals:   05/16/15 0826  BP: 100/60  Pulse: 68  Temp: 98.7 F (37.1 C)  Resp: 16   Filed Vitals:   05/16/15 0826  Height: 5' 3.5" (1.613 m)  Weight: 113 lb (51.256 kg)    Body mass index is 19.7 kg/(m^2). Ideal Body Weight: Weight in (lb) to have BMI = 25: 143.1  GEN: WDWN, NAD, Non-toxic, A & O x 3, slim build, looks well HEENT: Atraumatic, Normocephalic. Neck supple. No masses, No LAD.  Bilateral TM wnl, oropharynx normal.  PEERL,EOMI.  Ears and Nose: No external deformity. CV: RRR, No M/G/R. No JVD. No thrill. No extra heart sounds. PULM: CTA B, no wheezes, crackles, rhonchi. No retractions. No resp. distress. No accessory muscle use. ABD: S, NT, ND. No rebound. No HSM. EXTR: No c/c/e NEURO Normal gait.  PSYCH: Normally interactive. Conversant. Not depressed or anxious appearing.  Calm demeanor.    Assessment and Plan Physical exam  Screening examination for infectious disease - Plan: HIV antibody, Hepatitis B surface antibody, Hepatitis B surface antigen, Hepatitis C antibody, RPR  Screening for deficiency anemia - Plan: CBC  Screening for hyperlipidemia - Plan: Lipid panel  Screening for diabetes mellitus - Plan: Comprehensive metabolic panel  Looking well today, I will be in touch with your labs asap  Signed Abbe Amsterdam, MD

## 2015-05-16 NOTE — Patient Instructions (Signed)
You are looking well today- take care, I will be in touch with your labs Do try to get some exercise when you can

## 2017-07-01 ENCOUNTER — Emergency Department (HOSPITAL_COMMUNITY)
Admission: EM | Admit: 2017-07-01 | Discharge: 2017-07-01 | Disposition: A | Discharge status: Home / Self Care | Payer: 59 | Attending: Emergency Medicine | Admitting: Emergency Medicine

## 2017-07-01 ENCOUNTER — Encounter (HOSPITAL_COMMUNITY): Payer: Self-pay

## 2017-07-01 ENCOUNTER — Other Ambulatory Visit: Payer: Self-pay

## 2017-07-01 DIAGNOSIS — B078 Other viral warts: Secondary | ICD-10-CM | POA: Diagnosis not present

## 2017-07-01 DIAGNOSIS — M79644 Pain in right finger(s): Secondary | ICD-10-CM | POA: Diagnosis present

## 2017-07-01 DIAGNOSIS — Z79899 Other long term (current) drug therapy: Secondary | ICD-10-CM | POA: Diagnosis not present

## 2017-07-01 NOTE — ED Provider Notes (Signed)
[facility]   CSN: [id number] Arrival date & time: 07/01/17  [id number]     History   Chief Complaint No chief complaint on file.   HPI Randy Roberts is a 26 y.o. male who presents today for pain on his right index finger times approximately 1 month.  This is a hard area.  He denies any infectious type symptoms.  He does not have similar lesions anywhere else.  He has not tried anything other than antibiotic ointment.  He does not have a history of foreign bodies, is not concerned that he has any foreign bodies, is not concerned that he has anything in his finger.  HPI  Past Medical History:  Diagnosis Date  . Allergy     There are no active problems to display for this patient.   History reviewed. No pertinent surgical history.     Home Medications    Prior to Admission medications   Medication Sig Start Date End Date Taking? Authorizing Provider  Multiple Vitamins-Minerals (MULTIVITAMIN WITH MINERALS) tablet Take 1 tablet by mouth daily.   Yes [provider]  fluticasone (FLONASE) 50 MCG/ACT nasal spray Place 2 sprays into the nose at bedtime. Patient not taking: Reported on 03/28/2015 12/08/12   Sherren MochaShaw, Eva N, MD    Family History No family history on file.  Social History Social History   Tobacco Use  . Smoking status: Never Smoker  . Smokeless tobacco: Never Used  Substance Use Topics  . Alcohol use: No  . Drug use: No     Allergies   Penicillins and Tetracyclines & related   Review of Systems Review of Systems  Constitutional: Negative for fever.  Skin:       Hard, painful area with swelling on his right index finger     Physical Exam Updated Vital Signs BP 134/74 (BP Location: Right Arm)   Pulse 70   Temp 97.6 F (36.4 C) (Oral)   Resp 16   SpO2 99%   Physical Exam  Constitutional: He appears well-developed and well-nourished. No distress.  HENT:  Head: Normocephalic and  atraumatic.  Eyes: Conjunctivae are normal.  Pulmonary/Chest: No respiratory distress.  Neurological: He is alert.  Skin: He is not diaphoretic.  There is a subcentimeter area of swelling with a central, flat growth over the palmar surface of the plantar proximal phalange.  This area has multiple colored spots.  There is no drainage or discharge from this area.  No abnormal erythema.  No obvious infection.  Psychiatric: He has a normal mood and affect. His behavior is normal.  Nursing note and vitals reviewed.    ED Treatments / Results  Labs (all labs ordered are listed, but only abnormal results are displayed) Labs Reviewed - No data to display  EKG  EKG Interpretation None       Radiology No results found.  Procedures Procedures (including critical care time)  Medications Ordered in ED Medications - No data to display   Initial Impression / Assessment and Plan / ED Course  I have reviewed the triage vital signs and the nursing notes.  Pertinent labs & imaging results that were available during my care of the patient were reviewed by me and considered in my medical decision making (see chart for details).    Randy Roberts presents today for evaluation of 1 month of a growth on his finger.  Growth is consistent with a wart.  He was advised on over-the-counter treatment,  and follow-up with PCP if this is unsuccessful.  No superimposed bacterial infection apparent.   Final Clinical Impressions(s) / ED Diagnoses   Final diagnoses:  Common wart    ED Discharge Orders    None       Norman ClayHammond, Dannie Woolen W, PA-C 07/01/17 1228    Tilden Fossaees, Eladio [place], MD 07/02/17 [phone]

## 2017-07-01 NOTE — Discharge Instructions (Signed)
There are many different over-the-counter medicines that can be used to treat warts.  Look for medicines containing salicylic acid.  These are medicines that need to be applied multiple times.  Some common brands are Compound W and Dr. Margart SicklesScholl's.  If this does not get better once you have completed the treatment courses then please follow-up with your doctor.

## 2017-07-01 NOTE — ED Triage Notes (Signed)
Patient complains of hard raised area to right index finger x 1 month, here to see if he has wart to same

## 2018-01-25 ENCOUNTER — Encounter: Payer: Self-pay | Admitting: Urgent Care

## 2018-01-25 ENCOUNTER — Ambulatory Visit (INDEPENDENT_AMBULATORY_CARE_PROVIDER_SITE_OTHER): Payer: 59 | Admitting: Urgent Care

## 2018-01-25 VITALS — BP 106/74 | HR 66 | Temp 98.1°F | Resp 16 | Ht 63.5 in | Wt 107.4 lb

## 2018-01-25 DIAGNOSIS — Z9109 Other allergy status, other than to drugs and biological substances: Secondary | ICD-10-CM

## 2018-01-25 DIAGNOSIS — Z1329 Encounter for screening for other suspected endocrine disorder: Secondary | ICD-10-CM

## 2018-01-25 DIAGNOSIS — Z0000 Encounter for general adult medical examination without abnormal findings: Secondary | ICD-10-CM | POA: Diagnosis not present

## 2018-01-25 DIAGNOSIS — Z13228 Encounter for screening for other metabolic disorders: Secondary | ICD-10-CM | POA: Diagnosis not present

## 2018-01-25 DIAGNOSIS — Z130 Encounter for screening for diseases of the blood and blood-forming organs and certain disorders involving the immune mechanism: Secondary | ICD-10-CM

## 2018-01-25 DIAGNOSIS — Z1321 Encounter for screening for nutritional disorder: Secondary | ICD-10-CM

## 2018-01-25 MED ORDER — PSEUDOEPHEDRINE HCL ER 120 MG PO TB12: 120.0000 mg | ORAL_TABLET | Freq: Two times a day (BID) | ORAL | 3 refills | Status: DC

## 2018-01-25 NOTE — Patient Instructions (Addendum)
Health Maintenance, Male A healthy lifestyle and preventive care is important for your health and wellness. Ask your health care provider about what schedule of regular examinations is right for you. What should I know about weight and diet? Eat a Healthy Diet  Eat plenty of vegetables, fruits, whole grains, low-fat dairy products, and lean protein.  Do not eat a lot of foods high in solid fats, added sugars, or salt.  Maintain a Healthy Weight Regular exercise can help you achieve or maintain a healthy weight. You should:  Do at least 150 minutes of exercise each week. The exercise should increase your heart rate and make you sweat (moderate-intensity exercise).  Do strength-training exercises at least twice a week.  Watch Your Levels of Cholesterol and Blood Lipids  Have your blood tested for lipids and cholesterol every 5 years starting at 29 years of age. If you are at high risk for heart disease, you should start having your blood tested when you are 29 years old. You may need to have your cholesterol levels checked more often if: ? Your lipid or cholesterol levels are high. ? You are older than 29 years of age. ? You are at high risk for heart disease.  What should I know about cancer screening? Many types of cancers can be detected early and may often be prevented. Lung Cancer  You should be screened every year for lung cancer if: ? You are a current smoker who has smoked for at least 30 years. ? You are a former smoker who has quit within the past 15 years.  Talk to your health care provider about your screening options, when you should start screening, and how often you should be screened.  Colorectal Cancer  Routine colorectal cancer screening usually begins at 29 years of age and should be repeated every 5-10 years until you are 29 years old. You may need to be screened more often if early forms of precancerous polyps or small growths are found. Your health care provider  may recommend screening at an earlier age if you have risk factors for colon cancer.  Your health care provider may recommend using home test kits to check for hidden blood in the stool.  A small camera at the end of a tube can be used to examine your colon (sigmoidoscopy or colonoscopy). This checks for the earliest forms of colorectal cancer.  Prostate and Testicular Cancer  Depending on your age and overall health, your health care provider may do certain tests to screen for prostate and testicular cancer.  Talk to your health care provider about any symptoms or concerns you have about testicular or prostate cancer.  Skin Cancer  Check your skin from head to toe regularly.  Tell your health care provider about any new moles or changes in moles, especially if: ? There is a change in a mole's size, shape, or color. ? You have a mole that is larger than a pencil eraser.  Always use sunscreen. Apply sunscreen liberally and repeat throughout the day.  Protect yourself by wearing long sleeves, pants, a wide-brimmed hat, and sunglasses when outside.  What should I know about heart disease, diabetes, and high blood pressure?  If you are 18-39 years of age, have your blood pressure checked every 3-5 years. If you are 40 years of age or older, have your blood pressure checked every year. You should have your blood pressure measured twice-once when you are at a hospital or clinic, and once when   you are not at a hospital or clinic. Record the average of the two measurements. To check your blood pressure when you are not at a hospital or clinic, you can use: ? An automated blood pressure machine at a pharmacy. ? A home blood pressure monitor.  Talk to your health care provider about your target blood pressure.  If you are between 45-79 years old, ask your health care provider if you should take aspirin to prevent heart disease.  Have regular diabetes screenings by checking your fasting blood  sugar level. ? If you are at a normal weight and have a low risk for diabetes, have this test once every three years after the age of 45. ? If you are overweight and have a high risk for diabetes, consider being tested at a younger age or more often.  A one-time screening for abdominal aortic aneurysm (AAA) by ultrasound is recommended for men aged 65-75 years who are current or former smokers. What should I know about preventing infection? Hepatitis B If you have a higher risk for hepatitis B, you should be screened for this virus. Talk with your health care provider to find out if you are at risk for hepatitis B infection. Hepatitis C Blood testing is recommended for:  Everyone born from 1945 through 1965.  Anyone with known risk factors for hepatitis C.  Sexually Transmitted Diseases (STDs)  You should be screened each year for STDs including gonorrhea and chlamydia if: ? You are sexually active and are younger than 29 years of age. ? You are older than 29 years of age and your health care provider tells you that you are at risk for this type of infection. ? Your sexual activity has changed since you were last screened and you are at an increased risk for chlamydia or gonorrhea. Ask your health care provider if you are at risk.  Talk with your health care provider about whether you are at high risk of being infected with HIV. Your health care provider may recommend a prescription medicine to help prevent HIV infection.  What else can I do?  Schedule regular health, dental, and eye exams.  Stay current with your vaccines (immunizations).  Do not use any tobacco products, such as cigarettes, chewing tobacco, and e-cigarettes. If you need help quitting, ask your health care provider.  Limit alcohol intake to no more than 2 drinks per day. One drink equals 12 ounces of beer, 5 ounces of wine, or 1 ounces of hard liquor.  Do not use street drugs.  Do not share needles.  Ask your  health care provider for help if you need support or information about quitting drugs.  Tell your health care provider if you often feel depressed.  Tell your health care provider if you have ever been abused or do not feel safe at home. This information is not intended to replace advice given to you by your health care provider. Make sure you discuss any questions you have with your health care provider. Document Released: 01/26/2008 Document Revised: 03/28/2016 Document Reviewed: 05/03/2015 Elsevier Interactive Patient Education  2018 Elsevier Inc.     IF you received an x-ray today, you will receive an invoice from [facility]. Please contact [facility] at [phone] with questions or concerns regarding your invoice.   IF you received labwork today, you will receive an invoice from [facility]. Please contact [facility] at [phone] with questions or concerns regarding your invoice.   Our billing staff will not be   able to assist you with questions regarding bills from these companies.  You will be contacted with the lab results as soon as they are available. The fastest way to get your results is to activate your My Chart account. Instructions are located on the last page of this paperwork. If you have not heard from us regarding the results in 2 weeks, please contact this office.       

## 2018-01-25 NOTE — Progress Notes (Addendum)
MRN: [id number]  Subjective:   Mr. Randy Roberts is a 27 y.o. male presenting for annual physical exam.  Patient is married, works as a Naval architect. Has good relationships at home, has a good support network. Denies smoking cigarettes or drinking alcohol.   Medical care team includes: PCP: No primary care provider on file. Vision: Wears glasses, has yearly eye exams. Dental: Gets dental cleanings once every 6 months. Specialists: None.  Randy Roberts has a current medication list which includes the following prescription(s): fluticasone and multivitamin with minerals. He is allergic to penicillins and tetracyclines & related. Randy Roberts  has a past medical history of Allergy.  Denies past surgical history. Denies family history of cancer, diabetes, HTN, HL, heart disease, stroke, mental illness.   Review of Systems  Constitutional: Negative for chills, diaphoresis, fever, malaise/fatigue and weight loss.  HENT: Negative for congestion, ear discharge, ear pain, hearing loss, nosebleeds, sore throat and tinnitus.   Eyes: Negative for blurred vision, double vision, photophobia, pain, discharge and redness.  Respiratory: Negative for cough, shortness of breath and wheezing.   Cardiovascular: Negative for chest pain, palpitations and leg swelling.  Gastrointestinal: Negative for abdominal pain, blood in stool, constipation, diarrhea, nausea and vomiting.  Genitourinary: Negative for dysuria, flank pain, frequency, hematuria and urgency.  Musculoskeletal: Negative for back pain, joint pain and myalgias.  Skin: Negative for itching and rash.  Neurological: Negative for dizziness, tingling, seizures, loss of consciousness, weakness and headaches.  Endo/Heme/Allergies: Negative for polydipsia.  Psychiatric/Behavioral: Negative for depression, hallucinations, memory loss, substance abuse and suicidal ideas. The patient is not nervous/anxious and does not have insomnia.    Tdap updated in 2011 per  patient.  Objective:   Vitals: BP 106/74 (BP Location: Left Arm, Patient Position: Sitting, Cuff Size: Normal)   Pulse 66   Temp 98.1 F (36.7 C) (Oral)   Resp 16   Ht 5' 3.5" (1.613 m)   Wt 107 lb 6.4 oz (48.7 kg)   SpO2 98%   BMI 18.73 kg/m   Physical Exam  Constitutional: He is oriented to person, place, and time. He appears well-developed and well-nourished.  HENT:  TM's intact bilaterally, no effusions or erythema. Nasal turbinates pink and moist, nasal passages patent. No sinus tenderness. Oropharynx clear, mucous membranes moist, dentition in good repair.  Eyes: Pupils are equal, round, and reactive to light. Conjunctivae and EOM are normal. Right eye exhibits no discharge. Left eye exhibits no discharge. No scleral icterus.  Neck: Normal range of motion. Neck supple. No thyromegaly present.  Cardiovascular: Normal rate, regular rhythm and intact distal pulses. Exam reveals no gallop and no friction rub.  No murmur heard. Pulmonary/Chest: No stridor. No respiratory distress. He has no wheezes. He has no rales.  Abdominal: Soft. Bowel sounds are normal. He exhibits no distension and no mass. There is no tenderness. There is no rebound and no guarding.  Musculoskeletal: Normal range of motion. He exhibits no edema or tenderness.  Lymphadenopathy:    He has no cervical adenopathy.  Neurological: He is alert and oriented to person, place, and time. He has normal reflexes. He displays normal reflexes. Coordination normal.  Skin: Skin is warm and dry. No rash noted. No erythema. No pallor.  Psychiatric: He has a normal mood and affect.    Assessment and Plan :   Annual physical exam  Screening for endocrine, nutritional, metabolic and immunity disorder - Plan: Comprehensive metabolic panel, Lipid panel, TSH, CBC  Environmental allergies  Patient is medically healthy, very  pleasant.  Labs pending. Discussed healthy lifestyle, diet, exercise, preventative care,  vaccinations, and addressed patient's concerns.  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  Counseled that he can use Flonase and pseudoephedrine for any symptoms of nasal congestion or bilateral ear fullness.  Also recommended he use over-the-counter medications such as ibuprofen or Tylenol.  Follow-up as needed.  Wallis BambergMario Kimley Apsey, PA-C [facility] [phone] 01/25/2018  1:44 PM

## 2018-01-26 ENCOUNTER — Encounter (HOSPITAL_COMMUNITY): Payer: Self-pay | Admitting: Urgent Care

## 2018-01-26 LAB — CBC
Hematocrit: 41.9 % (ref 37.5–51.0)
Hemoglobin: 14.2 g/dL (ref 13.0–17.7)
MCH: 29.2 pg (ref 26.6–33.0)
MCHC: 33.9 g/dL (ref 31.5–35.7)
MCV: 86 fL (ref 79–97)
PLATELETS: 295 10*3/uL (ref 150–450)
RBC: 4.86 x10E6/uL (ref 4.14–5.80)
RDW: 13.1 % (ref 12.3–15.4)
WBC: 5.6 10*3/uL (ref 3.4–10.8)

## 2018-01-26 LAB — COMPREHENSIVE METABOLIC PANEL
ALBUMIN: 4.8 g/dL (ref 3.5–5.5)
ALK PHOS: 54 IU/L (ref 39–117)
ALT: 14 IU/L (ref 0–44)
AST: 14 IU/L (ref 0–40)
Albumin/Globulin Ratio: 1.8 (ref 1.2–2.2)
BUN / CREAT RATIO: 19 (ref 9–20)
BUN: 15 mg/dL (ref 6–20)
Bilirubin Total: 0.8 mg/dL (ref 0.0–1.2)
CO2: 23 mmol/L (ref 20–29)
CREATININE: 0.81 mg/dL (ref 0.76–1.27)
Calcium: 9.6 mg/dL (ref 8.7–10.2)
Chloride: 102 mmol/L (ref 96–106)
GFR calc Af Amer: 139 mL/min/{1.73_m2} (ref >59)
GFR calc non Af Amer: 120 mL/min/{1.73_m2} (ref >59)
GLOBULIN, TOTAL: 2.7 g/dL (ref 1.5–4.5)
Glucose: 88 mg/dL (ref 65–99)
Potassium: 4.4 mmol/L (ref 3.5–5.2)
SODIUM: 141 mmol/L (ref 134–144)
Total Protein: 7.5 g/dL (ref 6.0–8.5)

## 2018-01-26 LAB — LIPID PANEL
CHOL/HDL RATIO: 4 ratio (ref 0.0–5.0)
CHOLESTEROL TOTAL: 170 mg/dL (ref 100–199)
HDL: 43 mg/dL (ref >39)
LDL CALC: 109 mg/dL — AB (ref 0–99)
Triglycerides: 92 mg/dL (ref 0–149)
VLDL Cholesterol Cal: 18 mg/dL (ref 5–40)

## 2018-01-26 LAB — TSH: TSH: 1.04 u[IU]/mL (ref 0.450–4.500)

## 2019-12-30 ENCOUNTER — Ambulatory Visit: Payer: Self-pay | Attending: Internal Medicine

## 2019-12-30 DIAGNOSIS — Z20822 Contact with and (suspected) exposure to covid-19: Secondary | ICD-10-CM

## 2019-12-31 ENCOUNTER — Ambulatory Visit: Payer: Self-pay | Attending: Internal Medicine

## 2019-12-31 ENCOUNTER — Telehealth: Payer: Self-pay | Admitting: Family Medicine

## 2019-12-31 DIAGNOSIS — Z23 Encounter for immunization: Secondary | ICD-10-CM

## 2019-12-31 LAB — SARS-COV-2, NAA 2 DAY TAT

## 2019-12-31 LAB — NOVEL CORONAVIRUS, NAA: SARS-CoV-2, NAA: NOT DETECTED

## 2019-12-31 NOTE — Telephone Encounter (Signed)
Pt got his covid test done at [facility] on 12/30/19. He is needing a letter from our office stating he was tested for his job. Pt says he can come in to pick up letter. Please advise at [phone].

## 2019-12-31 NOTE — Progress Notes (Signed)
   Covid-19 Vaccination Clinic  Name:  Randy Roberts    MRN: [id number] DOB: [birth year]  12/31/2019  [name] was observed post Covid-19 immunization for 15 minutes without incident. He was provided with Vaccine Information Sheet and instruction to access the V-Safe system.   [name] was instructed to call 911 with any severe reactions post vaccine: Marland Kitchen Difficulty breathing  . Swelling of face and throat  . A fast heartbeat  . A bad rash all over body  . Dizziness and weakness   Immunizations Administered    Name Date Dose VIS Date Route   Pfizer COVID-19 Vaccine 12/31/2019  2:25 PM 0.3 mL 10/07/2018 Intramuscular   Manufacturer: [employer], [employer]   Lot: [id number]   NDC: [address]-3

## 2019-12-31 NOTE — Telephone Encounter (Signed)
This pt is asking for a letter clearing him from covid, he has not been in since 02/2018. Pls call pt for appt to est care

## 2020-01-01 ENCOUNTER — Ambulatory Visit: Payer: 59 | Admitting: Registered Nurse

## 2020-01-01 ENCOUNTER — Telehealth: Payer: Self-pay | Admitting: Physician Assistant

## 2020-01-01 ENCOUNTER — Encounter: Payer: Self-pay | Admitting: Physician Assistant

## 2020-01-01 ENCOUNTER — Telehealth: Payer: Self-pay | Admitting: *Deleted

## 2020-01-01 DIAGNOSIS — Z20822 Contact with and (suspected) exposure to covid-19: Secondary | ICD-10-CM

## 2020-01-01 NOTE — Telephone Encounter (Signed)
Pt called back and talked to [place]. Pt is calling urgent care to see if he can get a letter from them.

## 2020-01-01 NOTE — Telephone Encounter (Signed)
Patient reports he has been exposed to COVID- he needs a letter for work and has no PCP- advised E- visit for  documentation and advise.

## 2020-01-01 NOTE — Progress Notes (Signed)
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 You indicated that last exposure was Dec 25, 2019. A work note to quarantine 14 days from last exposure has been provided in your chart under letters/communications.    [place] has multiple testing sites. For information on our [place] testing locations and hours go to [email or web address]  We are enrolling you in our [place] for [place] . Daily you will receive a questionnaire within the [facility] website. Our COVID 19 response team will be monitoring your responses daily.     Testing Information: The COVID-19 Community Testing sites will begin testing BY APPOINTMENT ONLY.  You can schedule online at [email or web address]  If you do not have access to a smart phone or computer you may call [phone] for an appointment.   Additional testing sites in the Community:  . For [facility] Testing sites in [facility]  FaceUpdate.[email or web address]  . For Pop-up testing sites in [place]  [email or web address].co.[email or web address]  . For Testing sites with regular hours [email or web address]  . For [place] [email or web address]  . For [facility] [email or web address]  . For [facility] testing in [place] and [employer] [place] [email or web address]  . For [facility] testing in [facility]   [email or web address]  For  more information about community testing call [phone]   Please quarantine yourself while awaiting your test results. Please stay home for a minimum  of 10 days from the first day of illness with improving symptoms and you have had 24 hours of no fever (without the use of Tylenol (Acetaminophen) Motrin (Ibuprofen) or any fever reducing medication).  Also - Do not get tested prior to returning to work because once you have had a positive test the test can stay positive for more then a month in some cases.   You should wear a mask or cloth face covering over your nose and mouth if you must be around other people or animals, including pets (even at home). Try to stay at least 6 feet away from other people. This will protect the people around you.  Please continue good preventive care measures, including:  frequent hand-washing, avoid touching your face, cover coughs/sneezes, stay out of crowds and keep a 6 foot distance from others.  COVID-19 is a respiratory illness with symptoms that are similar to the flu. Symptoms are typically mild to moderate, but there have been cases of severe illness and death due to the virus.   The following symptoms may appear 2-14 days after exposure: . Fever . Cough . Shortness of breath or difficulty breathing . Chills . Repeated shaking with chills . Muscle pain . Headache . Sore throat . New loss of taste or smell . Fatigue . Congestion or runny nose . Nausea or vomiting . Diarrhea  Go to the nearest hospital ED for assessment if fever/cough/breathlessness are severe or illness seems like a threat to life.  It is vitally important that if you feel that you have an infection such as this virus or any other virus that you stay home and away from places where you may spread it to others.  You should avoid contact with people age 44 and older.   You may also take acetaminophen (Tylenol) as needed for fever.  Reduce your risk of any infection by using the same precautions used for avoiding the common cold or flu:  .  Wash your hands often with soap and warm water for at least 20 seconds.  If soap and water are not  readily available, use an alcohol-based hand sanitizer with at least 60% alcohol.  . If coughing or sneezing, cover your mouth and nose by coughing or sneezing into the elbow areas of your shirt or coat, into a tissue or into your sleeve (not your hands). . Avoid shaking hands with others and consider head nods or verbal greetings only. . Avoid touching your eyes, nose, or mouth with unwashed hands.  . Avoid close contact with people who are sick. . Avoid places or events with large numbers of people in one location, like concerts or sporting events. . Carefully consider travel plans you have or are making. . If you are planning any travel outside or inside the [place], visit the CDC's Travelers' Health webpage for the latest health notices. . If you have some symptoms but not all symptoms, continue to monitor at home and seek medical attention if your symptoms worsen. . If you are having a medical emergency, call 911.  HOME CARE . Only take medications as instructed by your medical team. . Drink plenty of fluids and get plenty of rest. . A steam or ultrasonic humidifier can help if you have congestion.   GET HELP RIGHT AWAY IF YOU HAVE EMERGENCY WARNING SIGNS** FOR COVID-19. If you or someone is showing any of these signs seek emergency medical care immediately. Call 911 or proceed to your closest emergency facility if: . You develop worsening high fever. . Trouble breathing . Bluish lips or face . Persistent pain or pressure in the chest . New confusion . Inability to wake or stay awake . You cough up blood. . Your symptoms become more severe  **This list is not all possible symptoms. Contact your medical provider for any symptoms that are sever or concerning to you.  MAKE SURE YOU   Understand these instructions.  Will watch your condition.  Will get help right away if you are not doing well or get worse.  Your e-visit answers were reviewed by a board certified advanced clinical  practitioner to complete your personal care plan.  Depending on the condition, your plan could have included both over the counter or prescription medications.  If there is a problem please reply once you have received a response from your provider.  Your safety is important to [place].  If you have drug allergies check your prescription carefully.    You can use MyChart to ask questions about today's visit, request a non-urgent call back, or ask for a work or school excuse for 24 hours related to this e-Visit. If it has been greater than 24 hours you will need to follow up with your provider, or enter a new e-Visit to address those concerns. You will get an e-mail in the next two days asking about your experience.  I hope that your e-visit has been valuable and will speed your recovery. Thank you for using e-visits.   I spent 5-10 minutes on review and completion of this note- Ill[facility] Level [facility]

## 2020-01-01 NOTE — Telephone Encounter (Signed)
Called pt to sch appt but he wasn't home. Wife answered and she will have him call when he is back.

## 2020-01-03 ENCOUNTER — Encounter (INDEPENDENT_AMBULATORY_CARE_PROVIDER_SITE_OTHER): Payer: Self-pay

## 2020-01-05 ENCOUNTER — Encounter (INDEPENDENT_AMBULATORY_CARE_PROVIDER_SITE_OTHER): Payer: Self-pay

## 2020-01-14 ENCOUNTER — Encounter (INDEPENDENT_AMBULATORY_CARE_PROVIDER_SITE_OTHER): Payer: Self-pay

## 2020-01-20 ENCOUNTER — Ambulatory Visit: Payer: Managed Care, Other (non HMO) | Attending: Internal Medicine

## 2020-01-20 DIAGNOSIS — Z20822 Contact with and (suspected) exposure to covid-19: Secondary | ICD-10-CM | POA: Insufficient documentation

## 2020-01-21 LAB — NOVEL CORONAVIRUS, NAA: SARS-CoV-2, NAA: NOT DETECTED

## 2020-01-21 LAB — SARS-COV-2, NAA 2 DAY TAT

## 2020-01-25 ENCOUNTER — Ambulatory Visit: Payer: Self-pay | Attending: Internal Medicine

## 2020-01-25 DIAGNOSIS — Z23 Encounter for immunization: Secondary | ICD-10-CM

## 2020-01-25 NOTE — Progress Notes (Signed)
° °  Covid-19 Vaccination Clinic  Name:  Randy Roberts    MRN: [id number] DOB: 1990/11/23  01/25/2020  [name] was observed post Covid-19 immunization for 15 minutes without incident. He was provided with Vaccine Information Sheet and instruction to access the V-Safe system.   [name] was instructed to call 911 with any severe reactions post vaccine:  Difficulty breathing   Swelling of face and throat   A fast heartbeat   A bad rash all over body   Dizziness and weakness   Immunizations Administered    Name Date Dose VIS Date Route   Pfizer COVID-19 Vaccine 01/25/2020  9:09 AM 0.3 mL 10/07/2018 Intramuscular   Manufacturer: [employer], [employer]   Lot: [id number]   NDC: [address]-2

## 2020-02-23 ENCOUNTER — Ambulatory Visit: Payer: Self-pay | Admitting: Family Medicine

## 2020-04-26 ENCOUNTER — Ambulatory Visit (INDEPENDENT_AMBULATORY_CARE_PROVIDER_SITE_OTHER): Payer: Managed Care, Other (non HMO) | Admitting: Emergency Medicine

## 2020-04-26 ENCOUNTER — Other Ambulatory Visit: Payer: Self-pay

## 2020-04-26 ENCOUNTER — Encounter: Payer: Self-pay | Admitting: Emergency Medicine

## 2020-04-26 VITALS — BP 103/67 | HR 73 | Temp 98.3°F | Resp 16 | Ht 64.0 in | Wt 102.0 lb

## 2020-04-26 DIAGNOSIS — Z0000 Encounter for general adult medical examination without abnormal findings: Secondary | ICD-10-CM | POA: Diagnosis not present

## 2020-04-26 DIAGNOSIS — Z681 Body mass index (BMI) 19 or less, adult: Secondary | ICD-10-CM | POA: Diagnosis not present

## 2020-04-26 LAB — CBC WITH DIFFERENTIAL/PLATELET
Basophils Absolute: 0 10*3/uL (ref 0.0–0.2)
Basos: 0 %
EOS (ABSOLUTE): 0.2 10*3/uL (ref 0.0–0.4)
Eos: 2 %
Hematocrit: 44.7 % (ref 37.5–51.0)
Hemoglobin: 14.8 g/dL (ref 13.0–17.7)
Immature Grans (Abs): 0 10*3/uL (ref 0.0–0.1)
Immature Granulocytes: 0 %
Lymphocytes Absolute: 2.6 10*3/uL (ref 0.7–3.1)
Lymphs: 35 %
MCH: 29 pg (ref 26.6–33.0)
MCHC: 33.1 g/dL (ref 31.5–35.7)
MCV: 88 fL (ref 79–97)
Monocytes Absolute: 0.5 10*3/uL (ref 0.1–0.9)
Monocytes: 6 %
Neutrophils Absolute: 4.2 10*3/uL (ref 1.4–7.0)
Neutrophils: 57 %
Platelets: 307 10*3/uL (ref 150–450)
RBC: 5.1 x10E6/uL (ref 4.14–5.80)
RDW: 12.3 % (ref 11.6–15.4)
WBC: 7.4 10*3/uL (ref 3.4–10.8)

## 2020-04-26 LAB — COMPREHENSIVE METABOLIC PANEL
ALT: 16 IU/L (ref 0–44)
AST: 16 IU/L (ref 0–40)
Albumin/Globulin Ratio: 1.8 (ref 1.2–2.2)
Albumin: 4.8 g/dL (ref 4.0–5.0)
Alkaline Phosphatase: 50 IU/L (ref 44–121)
BUN/Creatinine Ratio: 20 (ref 9–20)
BUN: 19 mg/dL (ref 6–20)
Bilirubin Total: 0.6 mg/dL (ref 0.0–1.2)
CO2: 24 mmol/L (ref 20–29)
Calcium: 9.7 mg/dL (ref 8.7–10.2)
Chloride: 103 mmol/L (ref 96–106)
Creatinine, Ser: 0.96 mg/dL (ref 0.76–1.27)
GFR calc Af Amer: 121 mL/min/{1.73_m2} (ref >59)
GFR calc non Af Amer: 105 mL/min/{1.73_m2} (ref >59)
Globulin, Total: 2.7 g/dL (ref 1.5–4.5)
Glucose: 93 mg/dL (ref 65–99)
Potassium: 4.7 mmol/L (ref 3.5–5.2)
Sodium: 140 mmol/L (ref 134–144)
Total Protein: 7.5 g/dL (ref 6.0–8.5)

## 2020-04-26 LAB — HEMOGLOBIN A1C
Est. average glucose Bld gHb Est-mCnc: 105 mg/dL
Hgb A1c MFr Bld: 5.3 % (ref 4.8–5.6)

## 2020-04-26 LAB — LIPID PANEL
Chol/HDL Ratio: 4.3 ratio (ref 0.0–5.0)
Cholesterol, Total: 172 mg/dL (ref 100–199)
HDL: 40 mg/dL (ref >39)
LDL Chol Calc (NIH): 98 mg/dL (ref 0–99)
Triglycerides: 196 mg/dL — ABNORMAL HIGH (ref 0–149)
VLDL Cholesterol Cal: 34 mg/dL (ref 5–40)

## 2020-04-26 NOTE — Progress Notes (Signed)
Randy Roberts 29 y.o.   Chief Complaint  Patient presents with  . Annual Exam    HISTORY OF PRESENT ILLNESS: This is a 29 y.o. male here for his annual exam. 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 No chronic medical problems.  No chronic medications. Has no complaints or medical concerns today. Had blood work done this morning while fasting.  HPI   Prior to Admission medications   Medication Sig Start Date End Date Taking? Authorizing Provider  Multiple Vitamins-Minerals (MULTIVITAMIN WITH MINERALS) tablet Take 1 tablet by mouth daily.   Yes [provider]  fluticasone (FLONASE) 50 MCG/ACT nasal spray Place 2 sprays into the nose at bedtime. Patient not taking: Reported on 03/28/2015 12/08/12   Sherren MochaShaw, Eva N, MD  pseudoephedrine (SUDAFED 12 HOUR) 120 MG 12 hr tablet Take 1 tablet (120 mg total) by mouth 2 (two) times daily. Patient not taking: Reported on 04/26/2020 01/25/18   Wallis BambergMani, Mario, PA-C    Allergies  Allergen Reactions  . Penicillins Itching    Has patient had a PCN reaction causing immediate rash, facial/tongue/throat swelling, SOB or lightheadedness with hypotension: Yes Has patient had a PCN reaction causing severe rash involving mucus membranes or skin necrosis: No Has patient had a PCN reaction that required hospitalization: No Has patient had a PCN reaction occurring within the last 10 years: No If all of the above answers are "NO", then may proceed with Cephalosporin use.   . Tetracyclines & Related Itching    There are no problems to display for this patient.   Past Medical History:  Diagnosis Date  . Allergy     History reviewed. No pertinent surgical history.  Social History   Socioeconomic History  . Marital status: Single    Spouse name: Not on file  . Number of children: Not on file  . Years of education: Not on file  . Highest education level: Not on file  Occupational History  . Not on  file  Tobacco Use  . Smoking status: Never Smoker  . Smokeless tobacco: Never Used  Substance and Sexual Activity  . Alcohol use: No  . Drug use: No  . Sexual activity: Never  Other Topics Concern  . Not on file  Social History Narrative  . Not on file   Social Determinants of Health   Financial Resource Strain:   . Difficulty of Paying Living Expenses: Not on file  Food Insecurity:   . Worried About Programme researcher, broadcasting/film/videounning Out of Food in the Last Year: Not on file  . Ran Out of Food in the Last Year: Not on file  Transportation Needs:   . Lack of Transportation (Medical): Not on file  . Lack of Transportation (Non-Medical): Not on file  Physical Activity:   . Days of Exercise per Week: Not on file  . Minutes of Exercise per Session: Not on file  Stress:   . Feeling of Stress : Not on file  Social Connections:   . Frequency of Communication with Friends and Family: Not on file  . Frequency of Social Gatherings with Friends and Family: Not on file  . Attends Religious Services: Not on file  . Active Member of Clubs or Organizations: Not on file  . Attends BankerClub or Organization Meetings: Not on file  . Marital Status: Not on file  Intimate Partner Violence:   . Fear of Current or Ex-Partner: Not on file  . Emotionally Abused: Not on file  .  Physically Abused: Not on file  . Sexually Abused: Not on file    History reviewed. No pertinent family history.   Review of Systems  Constitutional: Negative.  Negative for chills and fever.  HENT: Negative.  Negative for congestion and sore throat.   Respiratory: Negative.  Negative for cough and shortness of breath.   Cardiovascular: Negative.  Negative for chest pain and palpitations.  Gastrointestinal: Negative.  Negative for abdominal pain, blood in stool, diarrhea, melena, nausea and vomiting.  Genitourinary: Negative.  Negative for dysuria and hematuria.  Musculoskeletal: Negative.  Negative for back pain, joint pain, myalgias and neck  pain.  Skin: Negative.  Negative for rash.  Neurological: Negative.  Negative for dizziness and headaches.  Endo/Heme/Allergies: Negative.   All other systems reviewed and are negative.  Today's Vitals   04/26/20 1326  BP: 103/67  Pulse: 73  Resp: 16  Temp: 98.3 F (36.8 C)  TempSrc: Temporal  SpO2: 98%  Weight: 102 lb (46.3 kg)  Height: 5\' 4"  (1.626 m)   Body mass index is 17.51 kg/m.   Physical Exam Vitals reviewed.  Constitutional:      Appearance: Normal appearance.  HENT:     Head: Normocephalic and atraumatic.     Right Ear: Tympanic membrane, ear canal and external ear normal.     Left Ear: Tympanic membrane, ear canal and external ear normal.  Eyes:     Extraocular Movements: Extraocular movements intact.     Pupils: Pupils are equal, round, and reactive to light.  Cardiovascular:     Rate and Rhythm: Normal rate and regular rhythm.     Pulses: Normal pulses.     Heart sounds: Normal heart sounds.  Pulmonary:     Effort: Pulmonary effort is normal.     Breath sounds: Normal breath sounds.  Abdominal:     General: Bowel sounds are normal. There is no distension.     Palpations: Abdomen is soft.     Tenderness: There is no abdominal tenderness.  Musculoskeletal:        General: Normal range of motion.     Cervical back: Normal range of motion and neck supple.  Skin:    General: Skin is warm and dry.     Capillary Refill: Capillary refill takes less than 2 seconds.  Neurological:     General: No focal deficit present.     Mental Status: He is alert and oriented to person, place, and time.  Psychiatric:        Mood and Affect: Mood normal.        Behavior: Behavior normal.      ASSESSMENT & PLAN: Ching was seen today for annual exam.  Diagnoses and all orders for this visit:  Routine general medical examination at a health care facility  Body mass index (BMI) of 19 or less in adult    Patient Instructions       If you have lab work done  today you will be contacted with your lab results within the next 2 weeks.  If you have not heard from [facility] then please contact us. The fastest way to get your results is to register for My Chart.   IF you received an x-ray today, you will receive an invoice from [facility]. Please contact [facility] at [phone] with questions or concerns regarding your invoice.   IF you received labwork today, you will receive an invoice from [facility]. Please contact [facility] at [phone] with questions or concerns  regarding your invoice.   Our billing staff will not be able to assist you with questions regarding bills from these companies.  You will be contacted with the lab results as soon as they are available. The fastest way to get your results is to activate your My Chart account. Instructions are located on the last page of this paperwork. If you have not heard from [place] regarding the results in 2 weeks, please contact this office.      Health Maintenance, Male Adopting a healthy lifestyle and getting preventive care are important in promoting health and wellness. Ask your health care provider about:  The right schedule for you to have regular tests and exams.  Things you can do on your own to prevent diseases and keep yourself healthy. What should I know about diet, weight, and exercise? Eat a healthy diet   Eat a diet that includes plenty of vegetables, fruits, low-fat dairy products, and lean protein.  Do not eat a lot of foods that are high in solid fats, added sugars, or sodium. Maintain a healthy weight Body mass index (BMI) is a measurement that can be used to identify possible weight problems. It estimates body fat based on height and weight. Your health care provider can help determine your BMI and help you achieve or maintain a healthy weight. Get regular exercise Get regular exercise. This is one of the most important things you can do for your health. Most  adults should:  Exercise for at least 150 minutes each week. The exercise should increase your heart rate and make you sweat (moderate-intensity exercise).  Do strengthening exercises at least twice a week. This is in addition to the moderate-intensity exercise.  Spend less time sitting. Even light physical activity can be beneficial. Watch cholesterol and blood lipids Have your blood tested for lipids and cholesterol at 29 years of age, then have this test every 5 years. You may need to have your cholesterol levels checked more often if:  Your lipid or cholesterol levels are high.  You are older than 31 years of age.  You are at high risk for heart disease. What should I know about cancer screening? Many types of cancers can be detected early and may often be prevented. Depending on your health history and family history, you may need to have cancer screening at various ages. This may include screening for:  Colorectal cancer.  Prostate cancer.  Skin cancer.  Lung cancer. What should I know about heart disease, diabetes, and high blood pressure? Blood pressure and heart disease  High blood pressure causes heart disease and increases the risk of stroke. This is more likely to develop in people who have high blood pressure readings, are of African descent, or are overweight.  Talk with your health care provider about your target blood pressure readings.  Have your blood pressure checked: ? Every 3-5 years if you are 18-72 years of age. ? Every year if you are 67 years old or older.  If you are between the ages of 16 and 21 and are a current or former smoker, ask your health care provider if you should have a one-time screening for abdominal aortic aneurysm (AAA). Diabetes Have regular diabetes screenings. This checks your fasting blood sugar level. Have the screening done:  Once every three years after age 21 if you are at a normal weight and have a low risk for  diabetes.  More often and at a younger age if you are overweight  or have a high risk for diabetes. What should I know about preventing infection? Hepatitis B If you have a higher risk for hepatitis B, you should be screened for this virus. Talk with your health care provider to find out if you are at risk for hepatitis B infection. Hepatitis C Blood testing is recommended for:  Everyone born from [birth year] through 1967.  Anyone with known risk factors for hepatitis C. Sexually transmitted infections (STIs)  You should be screened each year for STIs, including gonorrhea and chlamydia, if: ? You are sexually active and are younger than 31 years of age. ? You are older than 31 years of age and your health care provider tells you that you are at risk for this type of infection. ? Your sexual activity has changed since you were last screened, and you are at increased risk for chlamydia or gonorrhea. Ask your health care provider if you are at risk.  Ask your health care provider about whether you are at high risk for HIV. Your health care provider may recommend a prescription medicine to help prevent HIV infection. If you choose to take medicine to prevent HIV, you should first get tested for HIV. You should then be tested every 3 months for as long as you are taking the medicine. Follow these instructions at home: Lifestyle  Do not use any products that contain nicotine or tobacco, such as cigarettes, e-cigarettes, and chewing tobacco. If you need help quitting, ask your health care provider.  Do not use street drugs.  Do not share needles.  Ask your health care provider for help if you need support or information about quitting drugs. Alcohol use  Do not drink alcohol if your health care provider tells you not to drink.  If you drink alcohol: ? Limit how much you have to 0-2 drinks a day. ? Be aware of how much alcohol is in your drink. In the [place] one drink equals one 12 oz bottle of  beer (355 mL), one 5 oz glass of wine (148 mL), or one 1 oz glass of hard liquor (44 mL). General instructions  Schedule regular health, dental, and eye exams.  Stay current with your vaccines.  Tell your health care provider if: ? You often feel depressed. ? You have ever been abused or do not feel safe at home. Summary  Adopting a healthy lifestyle and getting preventive care are important in promoting health and wellness.  Follow your health care provider's instructions about healthy diet, exercising, and getting tested or screened for diseases.  Follow your health care provider's instructions on monitoring your cholesterol and blood pressure. This information is not intended to replace advice given to you by your health care provider. Make sure you discuss any questions you have with your health care provider. Document Revised: 07/23/2018 Document Reviewed: 07/23/2018 Elsevier Patient Education  2020 Elsevier Inc.      Edwina Barth, MD [facility]

## 2020-04-26 NOTE — Patient Instructions (Addendum)
   If you have lab work done today you will be contacted with your lab results within the next 2 weeks.  If you have not heard from us then please contact us. The fastest way to get your results is to register for My Chart.   IF you received an x-ray today, you will receive an invoice from [facility]. Please contact [facility] at [phone] with questions or concerns regarding your invoice.   IF you received labwork today, you will receive an invoice from [facility]. Please contact [facility] at [phone] with questions or concerns regarding your invoice.   Our billing staff will not be able to assist you with questions regarding bills from these companies.  You will be contacted with the lab results as soon as they are available. The fastest way to get your results is to activate your My Chart account. Instructions are located on the last page of this paperwork. If you have not heard from us regarding the results in 2 weeks, please contact this office.      Health Maintenance, Male Adopting a healthy lifestyle and getting preventive care are important in promoting health and wellness. Ask your health care provider about:  The right schedule for you to have regular tests and exams.  Things you can do on your own to prevent diseases and keep yourself healthy. What should I know about diet, weight, and exercise? Eat a healthy diet   Eat a diet that includes plenty of vegetables, fruits, low-fat dairy products, and lean protein.  Do not eat a lot of foods that are high in solid fats, added sugars, or sodium. Maintain a healthy weight Body mass index (BMI) is a measurement that can be used to identify possible weight problems. It estimates body fat based on height and weight. Your health care provider can help determine your BMI and help you achieve or maintain a healthy weight. Get regular exercise Get regular exercise. This is one of the most important things you  can do for your health. Most adults should:  Exercise for at least 150 minutes each week. The exercise should increase your heart rate and make you sweat (moderate-intensity exercise).  Do strengthening exercises at least twice a week. This is in addition to the moderate-intensity exercise.  Spend less time sitting. Even light physical activity can be beneficial. Watch cholesterol and blood lipids Have your blood tested for lipids and cholesterol at 29 years of age, then have this test every 5 years. You may need to have your cholesterol levels checked more often if:  Your lipid or cholesterol levels are high.  You are older than 31 years of age.  You are at high risk for heart disease. What should I know about cancer screening? Many types of cancers can be detected early and may often be prevented. Depending on your health history and family history, you may need to have cancer screening at various ages. This may include screening for:  Colorectal cancer.  Prostate cancer.  Skin cancer.  Lung cancer. What should I know about heart disease, diabetes, and high blood pressure? Blood pressure and heart disease  High blood pressure causes heart disease and increases the risk of stroke. This is more likely to develop in people who have high blood pressure readings, are of African descent, or are overweight.  Talk with your health care provider about your target blood pressure readings.  Have your blood pressure checked: ? Every 3-5 years if you are 18-39   years of age. ? Every year if you are 40 years old or older.  If you are between the ages of 65 and 75 and are a current or former smoker, ask your health care provider if you should have a one-time screening for abdominal aortic aneurysm (AAA). Diabetes Have regular diabetes screenings. This checks your fasting blood sugar level. Have the screening done:  Once every three years after age 45 if you are at a normal weight and have  a low risk for diabetes.  More often and at a younger age if you are overweight or have a high risk for diabetes. What should I know about preventing infection? Hepatitis B If you have a higher risk for hepatitis B, you should be screened for this virus. Talk with your health care provider to find out if you are at risk for hepatitis B infection. Hepatitis C Blood testing is recommended for:  Everyone born from 1945 through 1965.  Anyone with known risk factors for hepatitis C. Sexually transmitted infections (STIs)  You should be screened each year for STIs, including gonorrhea and chlamydia, if: ? You are sexually active and are younger than 31 years of age. ? You are older than 31 years of age and your health care provider tells you that you are at risk for this type of infection. ? Your sexual activity has changed since you were last screened, and you are at increased risk for chlamydia or gonorrhea. Ask your health care provider if you are at risk.  Ask your health care provider about whether you are at high risk for HIV. Your health care provider may recommend a prescription medicine to help prevent HIV infection. If you choose to take medicine to prevent HIV, you should first get tested for HIV. You should then be tested every 3 months for as long as you are taking the medicine. Follow these instructions at home: Lifestyle  Do not use any products that contain nicotine or tobacco, such as cigarettes, e-cigarettes, and chewing tobacco. If you need help quitting, ask your health care provider.  Do not use street drugs.  Do not share needles.  Ask your health care provider for help if you need support or information about quitting drugs. Alcohol use  Do not drink alcohol if your health care provider tells you not to drink.  If you drink alcohol: ? Limit how much you have to 0-2 drinks a day. ? Be aware of how much alcohol is in your drink. In the [place] one drink equals one 12  oz bottle of beer (355 mL), one 5 oz glass of wine (148 mL), or one 1 oz glass of hard liquor (44 mL). General instructions  Schedule regular health, dental, and eye exams.  Stay current with your vaccines.  Tell your health care provider if: ? You often feel depressed. ? You have ever been abused or do not feel safe at home. Summary  Adopting a healthy lifestyle and getting preventive care are important in promoting health and wellness.  Follow your health care provider's instructions about healthy diet, exercising, and getting tested or screened for diseases.  Follow your health care provider's instructions on monitoring your cholesterol and blood pressure. This information is not intended to replace advice given to you by your health care provider. Make sure you discuss any questions you have with your health care provider. Document Revised: 07/23/2018 Document Reviewed: 07/23/2018 Elsevier Patient Education  2020 [employer]vier Inc.  

## 2020-08-02 ENCOUNTER — Ambulatory Visit: Payer: Managed Care, Other (non HMO) | Admitting: Emergency Medicine

## 2020-08-02 ENCOUNTER — Other Ambulatory Visit: Payer: Self-pay

## 2020-08-02 ENCOUNTER — Encounter: Payer: Self-pay | Admitting: Emergency Medicine

## 2020-08-02 VITALS — BP 107/68 | HR 89 | Temp 97.8°F | Ht 64.0 in | Wt 107.4 lb

## 2020-08-02 DIAGNOSIS — Z7689 Persons encountering health services in other specified circumstances: Secondary | ICD-10-CM

## 2020-08-02 DIAGNOSIS — Z23 Encounter for immunization: Secondary | ICD-10-CM

## 2020-08-02 NOTE — Patient Instructions (Addendum)
   If you have lab work done today you will be contacted with your lab results within the next 2 weeks.  If you have not heard from us then please contact us. The fastest way to get your results is to register for My Chart.   IF you received an x-ray today, you will receive an invoice from [facility]. Please contact [facility] at [phone] with questions or concerns regarding your invoice.   IF you received labwork today, you will receive an invoice from [facility]. Please contact [facility] at [phone] with questions or concerns regarding your invoice.   Our billing staff will not be able to assist you with questions regarding bills from these companies.  You will be contacted with the lab results as soon as they are available. The fastest way to get your results is to activate your My Chart account. Instructions are located on the last page of this paperwork. If you have not heard from us regarding the results in 2 weeks, please contact this office.      Health Maintenance, Male Adopting a healthy lifestyle and getting preventive care are important in promoting health and wellness. Ask your health care provider about:  The right schedule for you to have regular tests and exams.  Things you can do on your own to prevent diseases and keep yourself healthy. What should I know about diet, weight, and exercise? Eat a healthy diet   Eat a diet that includes plenty of vegetables, fruits, low-fat dairy products, and lean protein.  Do not eat a lot of foods that are high in solid fats, added sugars, or sodium. Maintain a healthy weight Body mass index (BMI) is a measurement that can be used to identify possible weight problems. It estimates body fat based on height and weight. Your health care provider can help determine your BMI and help you achieve or maintain a healthy weight. Get regular exercise Get regular exercise. This is one of the most important things you  can do for your health. Most adults should:  Exercise for at least 150 minutes each week. The exercise should increase your heart rate and make you sweat (moderate-intensity exercise).  Do strengthening exercises at least twice a week. This is in addition to the moderate-intensity exercise.  Spend less time sitting. Even light physical activity can be beneficial. Watch cholesterol and blood lipids Have your blood tested for lipids and cholesterol at 31 years of age, then have this test every 5 years. You may need to have your cholesterol levels checked more often if:  Your lipid or cholesterol levels are high.  You are older than 31 years of age.  You are at high risk for heart disease. What should I know about cancer screening? Many types of cancers can be detected early and may often be prevented. Depending on your health history and family history, you may need to have cancer screening at various ages. This may include screening for:  Colorectal cancer.  Prostate cancer.  Skin cancer.  Lung cancer. What should I know about heart disease, diabetes, and high blood pressure? Blood pressure and heart disease  High blood pressure causes heart disease and increases the risk of stroke. This is more likely to develop in people who have high blood pressure readings, are of African descent, or are overweight.  Talk with your health care provider about your target blood pressure readings.  Have your blood pressure checked: ? Every 3-5 years if you are 18-39   years of age. ? Every year if you are 40 years old or older.  If you are between the ages of 65 and 75 and are a current or former smoker, ask your health care provider if you should have a one-time screening for abdominal aortic aneurysm (AAA). Diabetes Have regular diabetes screenings. This checks your fasting blood sugar level. Have the screening done:  Once every three years after age 45 if you are at a normal weight and have  a low risk for diabetes.  More often and at a younger age if you are overweight or have a high risk for diabetes. What should I know about preventing infection? Hepatitis B If you have a higher risk for hepatitis B, you should be screened for this virus. Talk with your health care provider to find out if you are at risk for hepatitis B infection. Hepatitis C Blood testing is recommended for:  Everyone born from 1945 through 1965.  Anyone with known risk factors for hepatitis C. Sexually transmitted infections (STIs)  You should be screened each year for STIs, including gonorrhea and chlamydia, if: ? You are sexually active and are younger than 31 years of age. ? You are older than 31 years of age and your health care provider tells you that you are at risk for this type of infection. ? Your sexual activity has changed since you were last screened, and you are at increased risk for chlamydia or gonorrhea. Ask your health care provider if you are at risk.  Ask your health care provider about whether you are at high risk for HIV. Your health care provider may recommend a prescription medicine to help prevent HIV infection. If you choose to take medicine to prevent HIV, you should first get tested for HIV. You should then be tested every 3 months for as long as you are taking the medicine. Follow these instructions at home: Lifestyle  Do not use any products that contain nicotine or tobacco, such as cigarettes, e-cigarettes, and chewing tobacco. If you need help quitting, ask your health care provider.  Do not use street drugs.  Do not share needles.  Ask your health care provider for help if you need support or information about quitting drugs. Alcohol use  Do not drink alcohol if your health care provider tells you not to drink.  If you drink alcohol: ? Limit how much you have to 0-2 drinks a day. ? Be aware of how much alcohol is in your drink. In the [place] one drink equals one 12  oz bottle of beer (355 mL), one 5 oz glass of wine (148 mL), or one 1 oz glass of hard liquor (44 mL). General instructions  Schedule regular health, dental, and eye exams.  Stay current with your vaccines.  Tell your health care provider if: ? You often feel depressed. ? You have ever been abused or do not feel safe at home. Summary  Adopting a healthy lifestyle and getting preventive care are important in promoting health and wellness.  Follow your health care provider's instructions about healthy diet, exercising, and getting tested or screened for diseases.  Follow your health care provider's instructions on monitoring your cholesterol and blood pressure. This information is not intended to replace advice given to you by your health care provider. Make sure you discuss any questions you have with your health care provider. Document Revised: 07/23/2018 Document Reviewed: 07/23/2018 Elsevier Patient Education  2020 Elsevier Inc.  

## 2020-08-02 NOTE — Progress Notes (Signed)
Randy Roberts 29 y.o.   Chief Complaint  Patient presents with  . Establish Care    Patient have no other issues at this time    HISTORY OF PRESENT ILLNESS: This is a 29 y.o. male here to establish care with me. Has no complaints or medical concerns today. Fully vaccinated against Covid. Originally from [place].  [employer].  HPI   Prior to Admission medications   Medication Sig Start Date End Date Taking? Authorizing Provider  Multiple Vitamins-Minerals (MULTIVITAMIN WITH MINERALS) tablet Take 1 tablet by mouth daily.    [provider]    Allergies  Allergen Reactions  . Penicillins Itching    Has patient had a PCN reaction causing immediate rash, facial/tongue/throat swelling, SOB or lightheadedness with hypotension: Yes Has patient had a PCN reaction causing severe rash involving mucus membranes or skin necrosis: No Has patient had a PCN reaction that required hospitalization: No Has patient had a PCN reaction occurring within the last 10 years: No If all of the above answers are "NO", then may proceed with Cephalosporin use.   . Tetracyclines & Related Itching    There are no problems to display for this patient.   Past Medical History:  Diagnosis Date  . Allergy     No past surgical history on file.  Social History   Socioeconomic History  . Marital status: Married    Spouse name: Not on file  . Number of children: Not on file  . Years of education: Not on file  . Highest education level: Not on file  Occupational History  . Not on file  Tobacco Use  . Smoking status: Never Smoker  . Smokeless tobacco: Never Used  Substance and Sexual Activity  . Alcohol use: No  . Drug use: No  . Sexual activity: Never  Other Topics Concern  . Not on file  Social History Narrative  . Not on file   Social Determinants of Health   Financial Resource Strain: Not on file  Food Insecurity: Not on file  Transportation Needs: Not on file  Physical  Activity: Not on file  Stress: Not on file  Social Connections: Not on file  Intimate Partner Violence: Not on file    No family history on file.   Review of Systems  Constitutional: Negative.  Negative for chills and fever.  HENT: Negative.  Negative for congestion and sore throat.   Respiratory: Negative.  Negative for cough and shortness of breath.   Cardiovascular: Negative.  Negative for chest pain and palpitations.  Gastrointestinal: Negative.  Negative for abdominal pain, blood in stool, diarrhea, nausea and vomiting.  Genitourinary: Negative.  Negative for dysuria and hematuria.  Skin: Negative.  Negative for rash.  Neurological: Negative.  Negative for dizziness and headaches.  All other systems reviewed and are negative.  Today's Vitals   08/02/20 1311  BP: 107/68  Pulse: 89  Temp: 97.8 F (36.6 C)  TempSrc: Temporal  SpO2: 96%  Weight: 107 lb 6.4 oz (48.7 kg)  Height: 5\' 4"  (1.626 m)   Body mass index is 18.44 kg/m.   Physical Exam Vitals reviewed.  Constitutional:      Appearance: Normal appearance.  HENT:     Head: Normocephalic.  Eyes:     Extraocular Movements: Extraocular movements intact.     Conjunctiva/sclera: Conjunctivae normal.     Pupils: Pupils are equal, round, and reactive to light.  Cardiovascular:     Rate and Rhythm: Normal rate and regular rhythm.  Pulses: Normal pulses.     Heart sounds: Normal heart sounds.  Pulmonary:     Effort: Pulmonary effort is normal.     Breath sounds: Normal breath sounds.  Musculoskeletal:        General: Normal range of motion.     Cervical back: Normal range of motion and neck supple.  Skin:    General: Skin is warm and dry.     Capillary Refill: Capillary refill takes less than 2 seconds.  Neurological:     General: No focal deficit present.     Mental Status: He is alert and oriented to person, place, and time.  Psychiatric:        Mood and Affect: Mood normal.        Behavior: Behavior  normal.      ASSESSMENT & PLAN: Randy Roberts was seen today for establish care.  Diagnoses and all orders for this visit:  Encounter to establish care  Need for prophylactic vaccination and inoculation against influenza -     Flu Vaccine QUAD 6+ mos PF IM (Fluarix Quad PF)  Need for prophylactic vaccination with combined diphtheria-tetanus-pertussis (DTP) vaccine -     Tdap vaccine greater than or equal to 7yo IM    Patient Instructions       If you have lab work done today you will be contacted with your lab results within the next 2 weeks.  If you have not heard from us then please contact us. The fastest way to get your results is to register for My Chart.   IF you received an x-ray today, you will [facility]. Please contact [facility] at [phone] with questions or concerns regarding your invoice.   IF you received labwork today, you will receive an invoice from [facility]. Please contact [facility] at [phone] with questions or concerns regarding your invoice.   Our billing staff will not be able to assist you with questions regarding bills from these companies.  You will be contacted with the lab results as soon as they are available. The fastest way to get your results is to activate your My Chart account. Instructions are located on the last page of this paperwork. If you have not heard from us regarding the results in 2 weeks, please contact this office.      Health Maintenance, Male Adopting a healthy lifestyle and getting preventive care are important in promoting health and wellness. Ask your health care provider about:  The right schedule for you to have regular tests and exams.  Things you can do on your own to prevent diseases and keep yourself healthy. What should I know about diet, weight, and exercise? Eat a healthy diet   Eat a diet that includes plenty of vegetables, fruits, low-fat dairy products, and lean  protein.  Do not eat a lot of foods that are high in solid fats, added sugars, or sodium. Maintain a healthy weight Body mass index (BMI) is a measurement that can be used to identify possible weight problems. It estimates body fat based on height and weight. Your health care provider can help determine your BMI and help you achieve or maintain a healthy weight. Get regular exercise Get regular exercise. This is one of the most important things you can do for your health. Most adults should:  Exercise for at least 150 minutes each week. The exercise should increase your heart rate and make you sweat (moderate-intensity exercise).  Do strengthening exercises at least  twice a week. This is in addition to the moderate-intensity exercise.  Spend less time sitting. Even light physical activity can be beneficial. Watch cholesterol and blood lipids Have your blood tested for lipids and cholesterol at 31 years of age, then have this test every 5 years. You may need to have your cholesterol levels checked more often if:  Your lipid or cholesterol levels are high.  You are older than 31 years of age.  You are at high risk for heart disease. What should I know about cancer screening? Many types of cancers can be detected early and may often be prevented. Depending on your health history and family history, you may need to have cancer screening at various ages. This may include screening for:  Colorectal cancer.  Prostate cancer.  Skin cancer.  Lung cancer. What should I know about heart disease, diabetes, and high blood pressure? Blood pressure and heart disease  High blood pressure causes heart disease and increases the risk of stroke. This is more likely to develop in people who have high blood pressure readings, are of African descent, or are overweight.  Talk with your health care provider about your target blood pressure readings.  Have your blood pressure checked: ? Every 3-5 years  if you are 66-37 years of age. ? Every year if you are 16 years old or older.  If you are between the ages of 3 and 92 and are a current or former smoker, ask your health care provider if you should have a one-time screening for abdominal aortic aneurysm (AAA). Diabetes Have regular diabetes screenings. This checks your fasting blood sugar level. Have the screening done:  Once every three years after age 70 if you are at a normal weight and have a low risk for diabetes.  More often and at a younger age if you are overweight or have a high risk for diabetes. What should I know about preventing infection? Hepatitis B If you have a higher risk for hepatitis B, you should be screened for this virus. Talk with your health care provider to find out if you are at risk for hepatitis B infection. Hepatitis C Blood testing is recommended for:  Everyone born from [birth year] through 1965.  Anyone with known risk factors for hepatitis C. Sexually transmitted infections (STIs)  You should be screened each year for STIs, including gonorrhea and chlamydia, if: ? You are sexually active and are younger than 31 years of age. ? You are older than 31 years of age and your health care provider tells you that you are at risk for this type of infection. ? Your sexual activity has changed since you were last screened, and you are at increased risk for chlamydia or gonorrhea. Ask your health care provider if you are at risk.  Ask your health care provider about whether you are at high risk for HIV. Your health care provider may recommend a prescription medicine to help prevent HIV infection. If you choose to take medicine to prevent HIV, you should first get tested for HIV. You should then be tested every 3 months for as long as you are taking the medicine. Follow these instructions at home: Lifestyle  Do not use any products that contain nicotine or tobacco, such as cigarettes, e-cigarettes, and chewing tobacco. If  you need help quitting, ask your health care provider.  Do not use street drugs.  Do not share needles.  Ask your health care provider for help if you need support  or information about quitting drugs. Alcohol use  Do not drink alcohol if your health care provider tells you not to drink.  If you drink alcohol: ? Limit how much you have to 0-2 drinks a day. ? Be aware of how much alcohol is in your drink. In the [place] one drink equals one 12 oz bottle of beer (355 mL), one 5 oz glass of wine (148 mL), or one 1 oz glass of hard liquor (44 mL). General instructions  Schedule regular health, dental, and eye exams.  Stay current with your vaccines.  Tell your health care provider if: ? You often feel depressed. ? You have ever been abused or do not feel safe at home. Summary  Adopting a healthy lifestyle and getting preventive care are important in promoting health and wellness.  Follow your health care provider's instructions about healthy diet, exercising, and getting tested or screened for diseases.  Follow your health care provider's instructions on monitoring your cholesterol and blood pressure. This information is not intended to replace advice given to you by your health care provider. Make sure you discuss any questions you have with your health care provider. Document Revised: 07/23/2018 Document Reviewed: 07/23/2018 Elsevier Patient Education  2020 Elsevier Inc.      Edwina Barth, MD [facility]

## 2020-12-06 ENCOUNTER — Encounter: Payer: Self-pay | Admitting: Emergency Medicine

## 2020-12-06 ENCOUNTER — Ambulatory Visit: Payer: Managed Care, Other (non HMO) | Admitting: Emergency Medicine

## 2020-12-06 ENCOUNTER — Other Ambulatory Visit: Payer: Self-pay

## 2020-12-06 VITALS — BP 98/62 | HR 89 | Temp 97.8°F | Ht 66.0 in | Wt 103.8 lb

## 2020-12-06 DIAGNOSIS — L239 Allergic contact dermatitis, unspecified cause: Secondary | ICD-10-CM

## 2020-12-06 DIAGNOSIS — L299 Pruritus, unspecified: Secondary | ICD-10-CM

## 2020-12-06 MED ORDER — PREDNISONE 20 MG PO TABS: 20.0000 mg | ORAL_TABLET | Freq: Every day | ORAL | 0 refills | Status: AC

## 2020-12-06 NOTE — Progress Notes (Signed)
Randy Roberts 30 y.o.   Chief Complaint  Patient presents with  . Rash    Per patient on body for 2 weeks with itching and wants blood work to check liver.    HISTORY OF PRESENT ILLNESS: This is a 30 y.o. male complaining of intermittent generalized itching for the past 2 weeks without any other associated symptoms. Also wants to get blood work done to check his liver. No other complaints or medical concerns today.  HPI   Prior to Admission medications   Medication Sig Start Date End Date Taking? Authorizing Provider  MILK THISTLE PO Take by mouth.   Yes [provider]  Multiple Vitamins-Minerals (MULTIVITAMIN WITH MINERALS) tablet Take 1 tablet by mouth daily.   Yes [provider]    Allergies  Allergen Reactions  . Penicillins Itching    Has patient had a PCN reaction causing immediate rash, facial/tongue/throat swelling, SOB or lightheadedness with hypotension: Yes Has patient had a PCN reaction causing severe rash involving mucus membranes or skin necrosis: No Has patient had a PCN reaction that required hospitalization: No Has patient had a PCN reaction occurring within the last 10 years: No If all of the above answers are "NO", then may proceed with Cephalosporin use.   . Tetracyclines & Related Itching    There are no problems to display for this patient.   Past Medical History:  Diagnosis Date  . Allergy     No past surgical history on file.  Social History   Socioeconomic History  . Marital status: Married    Spouse name: Not on file  . Number of children: Not on file  . Years of education: Not on file  . Highest education level: Not on file  Occupational History  . Not on file  Tobacco Use  . Smoking status: Never Smoker  . Smokeless tobacco: Never Used  Substance and Sexual Activity  . Alcohol use: No  . Drug use: No  . Sexual activity: Never  Other Topics Concern  . Not on file  Social History Narrative  . Not on file    Social Determinants of Health   Financial Resource Strain: Not on file  Food Insecurity: Not on file  Transportation Needs: Not on file  Physical Activity: Not on file  Stress: Not on file  Social Connections: Not on file  Intimate Partner Violence: Not on file    No family history on file.   Review of Systems  Constitutional: Negative.  Negative for chills and fever.  HENT: Negative.  Negative for congestion and sore throat.   Respiratory: Negative.  Negative for cough and shortness of breath.   Cardiovascular: Negative.  Negative for chest pain and palpitations.  Gastrointestinal: Negative.  Negative for abdominal pain, diarrhea, nausea and vomiting.  Genitourinary: Negative.  Negative for dysuria.  Skin: Positive for itching and rash.  Neurological: Negative for dizziness and headaches.  All other systems reviewed and are negative.  Today's Vitals   12/06/20 1527  BP: 98/62  Pulse: 89  Temp: 97.8 F (36.6 C)  TempSrc: Oral  SpO2: 98%  Weight: 103 lb 12.8 oz (47.1 kg)  Height: 5\' 6"  (1.676 m)   Body mass index is 16.75 kg/m.   Physical Exam Vitals reviewed.  Constitutional:      Appearance: Normal appearance.  HENT:     Head: Normocephalic.     Mouth/Throat:     Mouth: Mucous membranes are moist.     Pharynx: Oropharynx is clear.  Eyes:     Extraocular Movements: Extraocular movements intact.     Conjunctiva/sclera: Conjunctivae normal.     Pupils: Pupils are equal, round, and reactive to light.  Cardiovascular:     Rate and Rhythm: Normal rate and regular rhythm.     Pulses: Normal pulses.     Heart sounds: Normal heart sounds.  Pulmonary:     Effort: Pulmonary effort is normal.     Breath sounds: Normal breath sounds.  Musculoskeletal:     Cervical back: Normal range of motion and neck supple.  Skin:    General: Skin is warm and dry.     Capillary Refill: Capillary refill takes less than 2 seconds.     Findings: Rash present.     Comments:  Faint diffuse mildly erythematous rash mostly on the trunk  Neurological:     General: No focal deficit present.     Mental Status: He is alert and oriented to person, place, and time.  Psychiatric:        Mood and Affect: Mood normal.        Behavior: Behavior normal.      ASSESSMENT & PLAN: Randy Roberts was seen today for rash.  Diagnoses and all orders for this visit:  Itching -     CBC with Differential/Platelet -     Comprehensive metabolic panel  Allergic dermatitis -     predniSONE (DELTASONE) 20 MG tablet; Take 1 tablet (20 mg total) by mouth daily with breakfast for 5 days.    Patient Instructions   Pruritus Pruritus is an itchy feeling on the skin. One of the most common causes is dry skin, but many different things can cause itching. Most cases of itching do not require medical attention. Sometimes itchy skin can turn into a rash. Follow these instructions at home: Skin care  Apply moisturizing lotion to your skin as needed. Lotion that contains petroleum jelly is best.  Take medicines or apply medicated creams only as told by your health care provider. This may include: ? Corticosteroid cream. ? Anti-itch lotions. ? Oral antihistamines.  Apply a cool, wet cloth (cool compress) to the affected areas.  Take baths with one of the following: ? Epsom salts. You can get these at your local pharmacy or grocery store. Follow the instructions on the packaging. ? Baking soda. Pour a small amount into the bath as told by your health care provider. ? Colloidal oatmeal. You can get this at your local pharmacy or grocery store. Follow the instructions on the packaging.  Apply baking soda paste to your skin. To make the paste, stir water into a small amount of baking soda until it reaches a paste-like consistency.  Do not scratch your skin.  Do not take hot showers or baths, which can make itching worse. A cool shower may help with itching as long as you apply moisturizing  lotion after the shower.  Do not use scented soaps, detergents, perfumes, and cosmetic products. Instead, use gentle, unscented versions of these items.   General instructions  Avoid wearing tight clothes.  Keep a journal to help find out what is causing your itching. Write down: ? What you eat and drink. ? What cosmetic products you use. ? What soaps or detergents you use. ? What you wear, including jewelry.  Use a humidifier. This keeps the air moist, which helps to prevent dry skin.  Be aware of any changes in your itchiness. Contact a health care provider if:  The itching  does not go away after several days.  You are unusually thirsty or urinating more than normal.  Your skin tingles or feels numb.  Your skin or the white parts of your eyes turn yellow (jaundice).  You feel weak.  You have any of the following: ? Night sweats. ? Tiredness (fatigue). ? Weight loss. ? Abdominal pain. Summary  Pruritus is an itchy feeling on the skin. One of the most common causes is dry skin, but many different conditions and factors can cause itching.  Apply moisturizing lotion to your skin as needed. Lotion that contains petroleum jelly is best.  Take medicines or apply medicated creams only as told by your health care provider.  Do not take hot showers or baths. Do not use scented soaps, detergents, perfumes, or cosmetic products. This information is not intended to replace advice given to you by your health care provider. Make sure you discuss any questions you have with your health care provider. Document Revised: 08/13/2017 Document Reviewed: 08/13/2017 Elsevier Patient Education  2021 Elsevier Inc.      Edwina Barth, MD [facility]

## 2020-12-06 NOTE — Patient Instructions (Signed)
Pruritus Pruritus is an itchy feeling on the skin. One of the most common causes is dry skin, but many different things can cause itching. Most cases of itching do not require medical attention. Sometimes itchy skin can turn into a rash. Follow these instructions at home: Skin care  Apply moisturizing lotion to your skin as needed. Lotion that contains petroleum jelly is best.  Take medicines or apply medicated creams only as told by your health care provider. This may include: ? Corticosteroid cream. ? Anti-itch lotions. ? Oral antihistamines.  Apply a cool, wet cloth (cool compress) to the affected areas.  Take baths with one of the following: ? Epsom salts. You can get these at your local pharmacy or grocery store. Follow the instructions on the packaging. ? Baking soda. Pour a small amount into the bath as told by your health care provider. ? Colloidal oatmeal. You can get this at your local pharmacy or grocery store. Follow the instructions on the packaging.  Apply baking soda paste to your skin. To make the paste, stir water into a small amount of baking soda until it reaches a paste-like consistency.  Do not scratch your skin.  Do not take hot showers or baths, which can make itching worse. A cool shower may help with itching as long as you apply moisturizing lotion after the shower.  Do not use scented soaps, detergents, perfumes, and cosmetic products. Instead, use gentle, unscented versions of these items.   General instructions  Avoid wearing tight clothes.  Keep a journal to help find out what is causing your itching. Write down: ? What you eat and drink. ? What cosmetic products you use. ? What soaps or detergents you use. ? What you wear, including jewelry.  Use a humidifier. This keeps the air moist, which helps to prevent dry skin.  Be aware of any changes in your itchiness. Contact a health care provider if:  The itching does not go away after several  days.  You are unusually thirsty or urinating more than normal.  Your skin tingles or feels numb.  Your skin or the white parts of your eyes turn yellow (jaundice).  You feel weak.  You have any of the following: ? Night sweats. ? Tiredness (fatigue). ? Weight loss. ? Abdominal pain. Summary  Pruritus is an itchy feeling on the skin. One of the most common causes is dry skin, but many different conditions and factors can cause itching.  Apply moisturizing lotion to your skin as needed. Lotion that contains petroleum jelly is best.  Take medicines or apply medicated creams only as told by your health care provider.  Do not take hot showers or baths. Do not use scented soaps, detergents, perfumes, or cosmetic products. This information is not intended to replace advice given to you by your health care provider. Make sure you discuss any questions you have with your health care provider. Document Revised: 08/13/2017 Document Reviewed: 08/13/2017 Elsevier Patient Education  2021 Elsevier Inc.  

## 2020-12-07 LAB — COMPREHENSIVE METABOLIC PANEL
ALT: 14 U/L (ref 0–53)
AST: 17 U/L (ref 0–37)
Albumin: 4.4 g/dL (ref 3.5–5.2)
Alkaline Phosphatase: 43 U/L (ref 39–117)
BUN: 21 mg/dL (ref 6–23)
CO2: 29 mEq/L (ref 19–32)
Calcium: 9.5 mg/dL (ref 8.4–10.5)
Chloride: 104 mEq/L (ref 96–112)
Creatinine, Ser: 1.13 mg/dL (ref 0.40–1.50)
GFR: 86.31 mL/min (ref >60.00)
Glucose, Bld: 82 mg/dL (ref 70–99)
Potassium: 4.8 mEq/L (ref 3.5–5.1)
Sodium: 139 mEq/L (ref 135–145)
Total Bilirubin: 0.5 mg/dL (ref 0.2–1.2)
Total Protein: 7.1 g/dL (ref 6.0–8.3)

## 2020-12-07 LAB — CBC WITH DIFFERENTIAL/PLATELET
Basophils Absolute: 0.1 10*3/uL (ref 0.0–0.1)
Basophils Relative: 0.5 % (ref 0.0–3.0)
Eosinophils Absolute: 0.2 10*3/uL (ref 0.0–0.7)
Eosinophils Relative: 2 % (ref 0.0–5.0)
HCT: 40.2 % (ref 39.0–52.0)
Hemoglobin: 13.8 g/dL (ref 13.0–17.0)
Lymphocytes Relative: 29.1 % (ref 12.0–46.0)
Lymphs Abs: 2.9 10*3/uL (ref 0.7–4.0)
MCHC: 34.4 g/dL (ref 30.0–36.0)
MCV: 86.7 fl (ref 78.0–100.0)
Monocytes Absolute: 0.7 10*3/uL (ref 0.1–1.0)
Monocytes Relative: 6.9 % (ref 3.0–12.0)
Neutro Abs: 6.1 10*3/uL (ref 1.4–7.7)
Neutrophils Relative %: 61.5 % (ref 43.0–77.0)
Platelets: 308 10*3/uL (ref 150.0–400.0)
RBC: 4.64 Mil/uL (ref 4.22–5.81)
RDW: 12.5 % (ref 11.5–15.5)
WBC: 9.9 10*3/uL (ref 4.0–10.5)

## 2021-10-03 ENCOUNTER — Encounter: Payer: Self-pay | Admitting: Family Medicine

## 2021-10-03 ENCOUNTER — Other Ambulatory Visit: Payer: Self-pay

## 2021-10-03 ENCOUNTER — Telehealth (INDEPENDENT_AMBULATORY_CARE_PROVIDER_SITE_OTHER): Payer: Managed Care, Other (non HMO) | Admitting: Family Medicine

## 2021-10-03 VITALS — Temp 100.4°F | Ht 66.0 in | Wt 103.8 lb

## 2021-10-03 DIAGNOSIS — R0981 Nasal congestion: Secondary | ICD-10-CM | POA: Diagnosis not present

## 2021-10-03 DIAGNOSIS — R059 Cough, unspecified: Secondary | ICD-10-CM

## 2021-10-03 DIAGNOSIS — J029 Acute pharyngitis, unspecified: Secondary | ICD-10-CM

## 2021-10-03 MED ORDER — BENZONATATE 100 MG PO CAPS: ORAL_CAPSULE | ORAL | 0 refills | Status: DC

## 2021-10-03 NOTE — Patient Instructions (Signed)
° ° ° °--------------------------------------------------------------------------------------------------------------------------- ° ° ° °  WORK SLIP:  Patient Randy Roberts,  25-Aug-1990, was seen for a medical visit today, 10/03/21 . Please excuse from work for a COVID/flu like illness. If Covid19 testing is positive advise 10 days minimum from the onset of symptoms (09/28/21) PLUS 1 day of no fever and improved symptoms. Will defer to employer for a sooner return to work if patient has negative covid testing and is feeling better, or if symptoms have resolved, it is greater than 5 days since the positive test and the patient can wear a high-quality, tight fitting mask such as N95 or KN95 at all times for an additional 5 days.   Sincerely: E-signature: Dr. Kriste Basque, DO [facility] [place] Ph: [phone]   ------------------------------------------------------------------------------------------------------------------------------    -I sent the medication(s) we discussed to your pharmacy: Meds ordered this encounter  Medications   benzonatate (TESSALON PERLES) 100 MG capsule    Sig: 1-2 capsules up to twice daily as needed for cough    Dispense:  30 capsule    Refill:  0   Warm salt water gargles twice daily  Aleve 1-2 tablets up to twice daily as needed for sore throat for 3 days  I hope you are feeling better soon!  Seek in person care promptly if your symptoms worsen, new concerns arise or you are not improving with treatment.  It was nice to meet you today. I help [facility] out with telemedicine visits on Tuesdays and Thursdays and am happy to help if you need a virtual follow up visit on those days. Otherwise, if you have any concerns or questions following this visit please schedule a follow up visit with your Primary Care office or seek care at a local urgent care clinic to avoid delays in care

## 2021-10-03 NOTE — Progress Notes (Signed)
Virtual Visit via Video Note  I connected with Randy Roberts  on 10/03/21 at 11:00 AM EST by a video enabled telemedicine application and verified that I am speaking with the correct person using two identifiers.  Location patient: [place] Location provider:work or home office Persons participating in the virtual visit: patient, provider  I discussed the limitations and requested verbal permission for telemedicine visit. The patient expressed understanding and agreed to proceed.   HPI:  Acute telemedicine visit for : -Onset: 5 days ago -Symptoms include: fever, runny nose, body aches, chills, cough, sore throat, vomited once - initially -fever resolved now -Denies: fever now, CP, SOB -wife and and kids were sick -did not do covid test yet -Pertinent past medical history: see below -Pertinent medication allergies: Allergies  Allergen Reactions   Penicillins Itching    Has patient had a PCN reaction causing immediate rash, facial/tongue/throat swelling, SOB or lightheadedness with hypotension: Yes Has patient had a PCN reaction causing severe rash involving mucus membranes or skin necrosis: No Has patient had a PCN reaction that required hospitalization: No Has patient had a PCN reaction occurring within the last 10 years: No If all of the above answers are "NO", then may proceed with Cephalosporin use.    Tetracyclines & Related Itching  -COVID-19 vaccine status:  Immunization History  Administered Date(s) Administered   Influenza Split 06/02/2012   Influenza,inj,Quad PF,6+ Mos 05/26/2013, 06/01/2014, 05/16/2015, 08/02/2020   PFIZER(Purple Top)SARS-COV-2 Vaccination 12/31/2019, 01/25/2020   Tdap 08/13/2009, 08/02/2020     ROS: See pertinent positives and negatives per HPI.  Past Medical History:  Diagnosis Date   Allergy     History reviewed. No pertinent surgical history.   Current Outpatient Medications:    benzonatate (TESSALON PERLES) 100 MG capsule, 1-2 capsules up to twice  daily as needed for cough, Disp: 30 capsule, Rfl: 0   MILK THISTLE PO, Take by mouth., Disp: , Rfl:    Multiple Vitamins-Minerals (MULTIVITAMIN WITH MINERALS) tablet, Take 1 tablet by mouth daily., Disp: , Rfl:   EXAM:  VITALS per patient if applicable:  GENERAL: alert, oriented, appears well and in no acute distress  HEENT: atraumatic, conjunttiva clear, no obvious abnormalities on inspection of external nose and ears  NECK: normal movements of the head and neck  LUNGS: on inspection no signs of respiratory distress, breathing rate appears normal, no obvious gross SOB, gasping or wheezing  CV: no obvious cyanosis  MS: moves all visible extremities without noticeable abnormality  PSYCH/NEURO: pleasant and cooperative, no obvious depression or anxiety, speech and thought processing grossly intact  ASSESSMENT AND PLAN:  Discussed the following assessment and plan:  Nasal congestion  Sore throat  Cough, unspecified type  -we discussed possible serious and likely etiologies, options for evaluation and workup, limitations of telemedicine visit vs in person visit, treatment, treatment risks and precautions. Pt is agreeable to treatment via telemedicine at this moment. Suspect covid, flu, other viral illness vs other. He has opted to do self covid testing, tessalon rx for cough, other symptomatic care measures per patient instructions.  Work/School slipped offered: provided in patient instructions   Advised to seek prompt virtual visit or in person care if worsening, new symptoms arise, or if is not improving with treatment as expected per our conversation of expected course. Discussed options for follow up care. Did let this patient know that I do telemedicine on Tuesdays and Thursdays for [facility] and those are the days I am logged into the system. Advised to schedule follow  up visit with PCP, [facility] virtual visits or [facility] if any further questions or concerns to avoid delays in care.    I discussed the assessment and treatment plan with the patient. The patient was provided an opportunity to ask questions and all were answered. The patient agreed with the plan and demonstrated an understanding of the instructions.     Terressa Koyanagi, DO

## 2021-10-09 ENCOUNTER — Telehealth: Payer: Self-pay

## 2021-10-09 NOTE — Telephone Encounter (Signed)
Pt is requesting an xray due to him still having a cough. The VV with Dr. Selena Batten she gave medications but pt still don't feel better.  Pt CB [phone]

## 2021-10-09 NOTE — Telephone Encounter (Signed)
Needs office visit.  Thanks.

## 2021-10-10 ENCOUNTER — Ambulatory Visit (INDEPENDENT_AMBULATORY_CARE_PROVIDER_SITE_OTHER): Payer: Managed Care, Other (non HMO)

## 2021-10-10 ENCOUNTER — Ambulatory Visit: Payer: Managed Care, Other (non HMO) | Admitting: Emergency Medicine

## 2021-10-10 ENCOUNTER — Encounter: Payer: Self-pay | Admitting: Emergency Medicine

## 2021-10-10 ENCOUNTER — Other Ambulatory Visit: Payer: Self-pay

## 2021-10-10 VITALS — BP 118/62 | HR 100 | Temp 99.0°F | Ht 65.0 in | Wt 107.0 lb

## 2021-10-10 DIAGNOSIS — U071 COVID-19: Secondary | ICD-10-CM | POA: Diagnosis not present

## 2021-10-10 DIAGNOSIS — R051 Acute cough: Secondary | ICD-10-CM | POA: Diagnosis not present

## 2021-10-10 DIAGNOSIS — R053 Chronic cough: Secondary | ICD-10-CM | POA: Insufficient documentation

## 2021-10-10 LAB — POC COVID19 BINAXNOW: SARS Coronavirus 2 Ag: POSITIVE — AB

## 2021-10-10 MED ORDER — GUAIFENESIN-CODEINE 100-10 MG/5ML PO SYRP: 5.0000 mL | ORAL_SOLUTION | Freq: Three times a day (TID) | ORAL | 1 refills | Status: DC | PRN

## 2021-10-10 MED ORDER — BENZONATATE 200 MG PO CAPS: 200.0000 mg | ORAL_CAPSULE | Freq: Two times a day (BID) | ORAL | 0 refills | Status: DC | PRN

## 2021-10-10 NOTE — Assessment & Plan Note (Signed)
Onset of symptoms 09/25/2021.  Outside the window for antivirals. Stable.  No red flag signs or symptoms.  No pneumonia on x-ray.  Saturating well. Normal vital signs.  Needs to isolate at home. At this point treatment is supportive.  Cough control. Medications as prescribed. Advised to stay home, rest, stay well-hydrated. ED precautions given. COVID instructions given.

## 2021-10-10 NOTE — Progress Notes (Signed)
Randy Roberts 31 y.o.   Chief Complaint  Patient presents with   Cough    Nasal congestion and bodyaches, x 1 weeks    HISTORY OF PRESENT ILLNESS: This is a 31 y.o. male complaining of flulike symptoms that started on 09/28/2021.  Still having congestion and persistent cough.  Body aches.  Denies fever or chills.  Tested negative for COVID twice.  Cough Pertinent negatives include no chest pain, chills, fever, headaches, hemoptysis, rash, shortness of breath or wheezing.    Prior to Admission medications   Medication Sig Start Date End Date Taking? Authorizing Provider  benzonatate (TESSALON PERLES) 100 MG capsule 1-2 capsules up to twice daily as needed for cough 10/03/21  Yes Kriste Basque R, DO  MILK THISTLE PO Take by mouth.   Yes [provider]  Multiple Vitamins-Minerals (MULTIVITAMIN WITH MINERALS) tablet Take 1 tablet by mouth daily.   Yes [provider]    Allergies  Allergen Reactions   Penicillins Itching    Has patient had a PCN reaction causing immediate rash, facial/tongue/throat swelling, SOB or lightheadedness with hypotension: Yes Has patient had a PCN reaction causing severe rash involving mucus membranes or skin necrosis: No Has patient had a PCN reaction that required hospitalization: No Has patient had a PCN reaction occurring within the last 10 years: No If all of the above answers are "NO", then may proceed with Cephalosporin use.    Tetracyclines & Related Itching    There are no problems to display for this patient.   Past Medical History:  Diagnosis Date   Allergy     No past surgical history on file.  Social History   Socioeconomic History   Marital status: Married    Spouse name: Not on file   Number of children: Not on file   Years of education: Not on file   Highest education level: Not on file  Occupational History   Not on file  Tobacco Use   Smoking status: Never   Smokeless tobacco: Never  Substance and Sexual  Activity   Alcohol use: No   Drug use: No   Sexual activity: Never  Other Topics Concern   Not on file  Social History Narrative   Not on file   Social Determinants of Health   Financial Resource Strain: Not on file  Food Insecurity: Not on file  Transportation Needs: Not on file  Physical Activity: Not on file  Stress: Not on file  Social Connections: Not on file  Intimate Partner Violence: Not on file    No family history on file.   Review of Systems  Constitutional: Negative.  Negative for chills and fever.  HENT:  Positive for congestion.   Respiratory:  Positive for cough. Negative for hemoptysis, sputum production, shortness of breath and wheezing.   Cardiovascular:  Negative for chest pain and palpitations.  Gastrointestinal:  Negative for abdominal pain, nausea and vomiting.  Genitourinary: Negative.   Skin: Negative.  Negative for rash.  Neurological: Negative.  Negative for dizziness and headaches.  All other systems reviewed and are negative. Today's Vitals   10/10/21 0808  BP: 118/62  Pulse: 100  Temp: 99 F (37.2 C)  TempSrc: Oral  SpO2: 97%  Weight: 107 lb (48.5 kg)  Height: 5\' 5"  (1.651 m)   Body mass index is 17.81 kg/m.   Physical Exam Vitals reviewed.  Constitutional:      Appearance: Normal appearance.  HENT:     Head: Normocephalic.  Mouth/Throat:     Mouth: Mucous membranes are moist.     Pharynx: Oropharynx is clear.  Eyes:     Extraocular Movements: Extraocular movements intact.     Pupils: Pupils are equal, round, and reactive to light.  Cardiovascular:     Rate and Rhythm: Normal rate and regular rhythm.     Pulses: Normal pulses.     Heart sounds: Normal heart sounds.  Pulmonary:     Effort: Pulmonary effort is normal.     Breath sounds: Normal breath sounds.  Musculoskeletal:     Cervical back: No tenderness.  Lymphadenopathy:     Cervical: No cervical adenopathy.  Neurological:     General: No focal deficit  present.     Mental Status: He is alert and oriented to person, place, and time.  Psychiatric:        Mood and Affect: Mood normal.        Behavior: Behavior normal.   Results for orders placed or performed in visit on 10/10/21 (from the past 24 hour(s))  POC COVID-19     Status: Abnormal   Collection Time: 10/10/21  8:37 AM  Result Value Ref Range   SARS Coronavirus 2 Ag Positive (A) Negative   DG Chest 2 View  Result Date: 10/10/2021 CLINICAL DATA:  Acute cough. Persistent cough. Back pain with coughing for 2 weeks. Former smoker. EXAM: CHEST - 2 VIEW COMPARISON:  No pertinent prior exams available for comparison. FINDINGS: Heart size within normal limits. No appreciable airspace consolidation. No evidence of pleural effusion or pneumothorax. No acute bony abnormality identified. IMPRESSION: No evidence of active cardiopulmonary disease. Electronically Signed   By: Kellie Simmering D.O.   On: 10/10/2021 08:41     ASSESSMENT & PLAN: A total of 45 minutes was spent with the patient and counseling/coordination of care regarding preparing for this visit, review of most recent office visit notes, review of past medical history, review of all medications, review of chest x-ray done today, review of most recent blood work results including today's COVID test, COVID instructions and precautions, prognosis, documentation and need for follow-up.  Problem List Items Addressed This Visit       Other   COVID-19 virus infection    Onset of symptoms 09/25/2021.  Outside the window for antivirals. Stable.  No red flag signs or symptoms.  No pneumonia on x-ray.  Saturating well. Normal vital signs.  Needs to isolate at home. At this point treatment is supportive.  Cough control. Medications as prescribed. Advised to stay home, rest, stay well-hydrated. ED precautions given. COVID instructions given.      Acute cough - Primary   Relevant Medications   benzonatate (TESSALON) 200 MG capsule    guaiFENesin-codeine (ROBITUSSIN AC) 100-10 MG/5ML syrup   Other Relevant Orders   DG Chest 2 View (Completed)   POC COVID-19 (Completed)   Patient Instructions  COVID-19: What to Do if You Are Sick If you test positive and are an older adult or someone who is at high risk of getting very sick from COVID-19, treatment may be available. Contact a healthcare provider right away after a positive test to determine if you are eligible, even if your symptoms are mild right now. You can also visit a Test to Treat location and, if eligible, receive a prescription from a provider. Don't delay: Treatment must be started within the first few days to be effective. If you have a fever, cough, or other symptoms, you might have COVID-19.  Most people have mild illness and are able to recover at home. If you are sick: Keep track of your symptoms. If you have an emergency warning sign (including trouble breathing), call 911. Steps to help prevent the spread of COVID-19 if you are sick If you are sick with COVID-19 or think you might have COVID-19, follow the steps below to care for yourself and to help protect other people in your home and community. Stay home except to get medical care Stay home. Most people with COVID-19 have mild illness and can recover at home without medical care. Do not leave your home, except to get medical care. Do not visit public areas and do not go to places where you are unable to wear a mask. Take care of yourself. Get rest and stay hydrated. Take over-the-counter medicines, such as acetaminophen, to help you feel better. Stay in touch with your doctor. Call before you get medical care. Be sure to get care if you have trouble breathing, or have any other emergency warning signs, or if you think it is an emergency. Avoid public transportation, ride-sharing, or taxis if possible. Get tested If you have symptoms of COVID-19, get tested. While waiting for test results, stay away from  others, including staying apart from those living in your household. Get tested as soon as possible after your symptoms start. Treatments may be available for people with COVID-19 who are at risk for becoming very sick. Don't delay: Treatment must be started early to be effective--some treatments must begin within 5 days of your first symptoms. Contact your healthcare provider right away if your test result is positive to determine if you are eligible. Self-tests are one of several options for testing for the virus that causes COVID-19 and may be more convenient than laboratory-based tests and point-of-care tests. Ask your healthcare provider or your local health department if you need help interpreting your test results. You can visit your state, tribal, local, and territorial health department's website to look for the latest local information on testing sites. Separate yourself from other people As much as possible, stay in a specific room and away from other people and pets in your home. If possible, you should use a separate bathroom. If you need to be around other people or animals in or outside of the home, wear a well-fitting mask. Tell your close contacts that they may have been exposed to COVID-19. An infected person can spread COVID-19 starting 48 hours (or 2 days) before the person has any symptoms or tests positive. By letting your close contacts know they may have been exposed to COVID-19, you are helping to protect everyone. See COVID-19 and Animals if you have questions about pets. If you are diagnosed with COVID-19, someone from the health department may call you. Answer the call to slow the spread. Monitor your symptoms Symptoms of COVID-19 include fever, cough, or other symptoms. Follow care instructions from your healthcare provider and local health department. Your local health authorities may give instructions on checking your symptoms and reporting information. When to seek  emergency medical attention Look for emergency warning signs* for COVID-19. If someone is showing any of these signs, seek emergency medical care immediately: Trouble breathing Persistent pain or pressure in the chest New confusion Inability to wake or stay awake Pale, gray, or blue-colored skin, lips, or nail beds, depending on skin tone *This list is not all possible symptoms. Please call your medical provider for any other symptoms that are severe or  concerning to you. Call 911 or call ahead to your local emergency facility: Notify the operator that you are seeking care for someone who has or may have COVID-19. Call ahead before visiting your doctor Call ahead. Many medical visits for routine care are being postponed or done by phone or telemedicine. If you have a medical appointment that cannot be postponed, call your doctor's office, and tell them you have or may have COVID-19. This will help the office protect themselves and other patients. If you are sick, wear a well-fitting mask You should wear a mask if you must be around other people or animals, including pets (even at home). Wear a mask with the best fit, protection, and comfort for you. You don't need to wear the mask if you are alone. If you can't put on a mask (because of trouble breathing, for example), cover your coughs and sneezes in some other way. Try to stay at least 6 feet away from other people. This will help protect the people around you. Masks should not be placed on young children under age 21 years, anyone who has trouble breathing, or anyone who is not able to remove the mask without help. Cover your coughs and sneezes Cover your mouth and nose with a tissue when you cough or sneeze. Throw away used tissues in a lined trash can. Immediately wash your hands with soap and water for at least 20 seconds. If soap and water are not available, clean your hands with an alcohol-based hand sanitizer that contains at least 60%  alcohol. Clean your hands often Wash your hands often with soap and water for at least 20 seconds. This is especially important after blowing your nose, coughing, or sneezing; going to the bathroom; and before eating or preparing food. Use hand sanitizer if soap and water are not available. Use an alcohol-based hand sanitizer with at least 60% alcohol, covering all surfaces of your hands and rubbing them together until they feel dry. Soap and water are the best option, especially if hands are visibly dirty. Avoid touching your eyes, nose, and mouth with unwashed hands. Handwashing Tips Avoid sharing personal household items Do not share dishes, drinking glasses, cups, eating utensils, towels, or bedding with other people in your home. Wash these items thoroughly after using them with soap and water or put in the dishwasher. Clean surfaces in your home regularly Clean and disinfect high-touch surfaces (for example, doorknobs, tables, handles, light switches, and countertops) in your "sick room" and bathroom. In shared spaces, you should clean and disinfect surfaces and items after each use by the person who is ill. If you are sick and cannot clean, a caregiver or other person should only clean and disinfect the area around you (such as your bedroom and bathroom) on an as needed basis. Your caregiver/other person should wait as long as possible (at least several hours) and wear a mask before entering, cleaning, and disinfecting shared spaces that you use. Clean and disinfect areas that may have blood, stool, or body fluids on them. Use household cleaners and disinfectants. Clean visible dirty surfaces with household cleaners containing soap or detergent. Then, use a household disinfectant. Use a product from [employer] List N: Disinfectants for Coronavirus ([id number]). Be sure to follow the instructions on the label to ensure safe and effective use of the product. Many products recommend keeping the surface  wet with a disinfectant for a certain period of time (look at "contact time" on the product label). You may also  need to wear personal protective equipment, such as gloves, depending on the directions on the product label. Immediately after disinfecting, wash your hands with soap and water for 20 seconds. For completed guidance on cleaning and disinfecting your home, visit Complete Disinfection Guidance. Take steps to improve ventilation at home Improve ventilation (air flow) at home to help prevent from spreading COVID-19 to other people in your household. Clear out COVID-19 virus particles in the air by opening windows, using air filters, and turning on fans in your home. Use this interactive tool to learn how to improve air flow in your home. When you can be around others after being sick with COVID-19 Deciding when you can be around others is different for different situations. Find out when you can safely end home isolation. For any additional questions about your care, contact your healthcare provider or state or local health department. 11/01/2020 Content source: [facility] ([facility]), Division of Viral Diseases This information is not intended to replace advice given to you by your health care provider. Make sure you discuss any questions you have with your health care provider. Document Revised: 04/21/2021 Document Reviewed: 04/21/2021 Elsevier Patient Education  2022 [place], MD [facility]

## 2021-10-10 NOTE — Patient Instructions (Signed)

## 2021-11-07 ENCOUNTER — Emergency Department (HOSPITAL_COMMUNITY)
Admission: EM | Admit: 2021-11-07 | Discharge: 2021-11-07 | Disposition: A | Discharge status: Home / Self Care | Payer: Managed Care, Other (non HMO) | Attending: Emergency Medicine | Admitting: Emergency Medicine

## 2021-11-07 ENCOUNTER — Encounter: Payer: Self-pay | Admitting: Emergency Medicine

## 2021-11-07 ENCOUNTER — Ambulatory Visit: Payer: Managed Care, Other (non HMO) | Admitting: Emergency Medicine

## 2021-11-07 ENCOUNTER — Emergency Department (HOSPITAL_COMMUNITY): Payer: Managed Care, Other (non HMO)

## 2021-11-07 ENCOUNTER — Encounter (HOSPITAL_COMMUNITY): Payer: Self-pay | Admitting: Pharmacy Technician

## 2021-11-07 ENCOUNTER — Other Ambulatory Visit: Payer: Self-pay

## 2021-11-07 VITALS — BP 110/74 | HR 107 | Temp 99.3°F | Ht 65.0 in | Wt 106.5 lb

## 2021-11-07 DIAGNOSIS — H6690 Otitis media, unspecified, unspecified ear: Secondary | ICD-10-CM | POA: Insufficient documentation

## 2021-11-07 DIAGNOSIS — H70002 Acute mastoiditis without complications, left ear: Secondary | ICD-10-CM | POA: Insufficient documentation

## 2021-11-07 DIAGNOSIS — J329 Chronic sinusitis, unspecified: Secondary | ICD-10-CM | POA: Insufficient documentation

## 2021-11-07 DIAGNOSIS — Z20822 Contact with and (suspected) exposure to covid-19: Secondary | ICD-10-CM | POA: Diagnosis not present

## 2021-11-07 DIAGNOSIS — H60312 Diffuse otitis externa, left ear: Secondary | ICD-10-CM | POA: Diagnosis not present

## 2021-11-07 DIAGNOSIS — R000 Tachycardia, unspecified: Secondary | ICD-10-CM | POA: Diagnosis not present

## 2021-11-07 DIAGNOSIS — L03211 Cellulitis of face: Secondary | ICD-10-CM | POA: Diagnosis not present

## 2021-11-07 DIAGNOSIS — R220 Localized swelling, mass and lump, head: Secondary | ICD-10-CM | POA: Diagnosis present

## 2021-11-07 LAB — COMPREHENSIVE METABOLIC PANEL
ALT: 31 U/L (ref 0–44)
AST: 24 U/L (ref 15–41)
Albumin: 3.7 g/dL (ref 3.5–5.0)
Alkaline Phosphatase: 56 U/L (ref 38–126)
Anion gap: 8 (ref 5–15)
BUN: 12 mg/dL (ref 6–20)
CO2: 27 mmol/L (ref 22–32)
Calcium: 9.1 mg/dL (ref 8.9–10.3)
Chloride: 100 mmol/L (ref 98–111)
Creatinine, Ser: 1.09 mg/dL (ref 0.61–1.24)
GFR, Estimated: >60 mL/min (ref >60)
Glucose, Bld: 111 mg/dL — ABNORMAL HIGH (ref 70–99)
Potassium: 4 mmol/L (ref 3.5–5.1)
Sodium: 135 mmol/L (ref 135–145)
Total Bilirubin: 0.7 mg/dL (ref 0.3–1.2)
Total Protein: 7.7 g/dL (ref 6.5–8.1)

## 2021-11-07 LAB — CBC WITH DIFFERENTIAL/PLATELET
Abs Immature Granulocytes: 0.1 10*3/uL — ABNORMAL HIGH (ref 0.00–0.07)
Basophils Absolute: 0 10*3/uL (ref 0.0–0.1)
Basophils Relative: 0 %
Eosinophils Absolute: 0.1 10*3/uL (ref 0.0–0.5)
Eosinophils Relative: 1 %
HCT: 40.4 % (ref 39.0–52.0)
Hemoglobin: 13.2 g/dL (ref 13.0–17.0)
Immature Granulocytes: 1 %
Lymphocytes Relative: 11 %
Lymphs Abs: 1.8 10*3/uL (ref 0.7–4.0)
MCH: 28.9 pg (ref 26.0–34.0)
MCHC: 32.7 g/dL (ref 30.0–36.0)
MCV: 88.6 fL (ref 80.0–100.0)
Monocytes Absolute: 1.2 10*3/uL — ABNORMAL HIGH (ref 0.1–1.0)
Monocytes Relative: 7 %
Neutro Abs: 13.8 10*3/uL — ABNORMAL HIGH (ref 1.7–7.7)
Neutrophils Relative %: 80 %
Platelets: 319 10*3/uL (ref 150–400)
RBC: 4.56 MIL/uL (ref 4.22–5.81)
RDW: 11.8 % (ref 11.5–15.5)
WBC: 17 10*3/uL — ABNORMAL HIGH (ref 4.0–10.5)
nRBC: 0 % (ref 0.0–0.2)

## 2021-11-07 LAB — URINALYSIS, ROUTINE W REFLEX MICROSCOPIC
Bilirubin Urine: NEGATIVE
Glucose, UA: NEGATIVE mg/dL
Hgb urine dipstick: NEGATIVE
Ketones, ur: NEGATIVE mg/dL
Leukocytes,Ua: NEGATIVE
Nitrite: NEGATIVE
Protein, ur: NEGATIVE mg/dL
Specific Gravity, Urine: 1.015 (ref 1.005–1.030)
pH: 8 (ref 5.0–8.0)

## 2021-11-07 LAB — RESP PANEL BY RT-PCR (FLU A&B, COVID) ARPGX2
Influenza A by PCR: NEGATIVE
Influenza B by PCR: NEGATIVE
SARS Coronavirus 2 by RT PCR: NEGATIVE

## 2021-11-07 LAB — LACTIC ACID, PLASMA: Lactic Acid, Venous: 1.6 mmol/L (ref 0.5–1.9)

## 2021-11-07 MED ORDER — IBUPROFEN 400 MG PO TABS
600.0000 mg | ORAL_TABLET | Freq: Once | ORAL | Status: AC
  Administered 2021-11-07: 600 mg via ORAL
  Filled 2021-11-07: qty 1

## 2021-11-07 MED ORDER — ACETAMINOPHEN 500 MG PO TABS
1000.0000 mg | ORAL_TABLET | Freq: Once | ORAL | Status: AC
  Administered 2021-11-07: 1000 mg via ORAL
  Filled 2021-11-07: qty 2

## 2021-11-07 MED ORDER — CEFDINIR 300 MG PO CAPS: 300.0000 mg | ORAL_CAPSULE | Freq: Two times a day (BID) | ORAL | 0 refills | Status: DC

## 2021-11-07 MED ORDER — ACETAMINOPHEN 500 MG PO TABS: 500.0000 mg | ORAL_TABLET | Freq: Four times a day (QID) | ORAL | 0 refills | Status: DC | PRN

## 2021-11-07 MED ORDER — SODIUM CHLORIDE 0.9 % IV SOLN
2.0000 g | Freq: Once | INTRAVENOUS | Status: AC
  Administered 2021-11-07: 2 g via INTRAVENOUS
  Filled 2021-11-07: qty 20

## 2021-11-07 MED ORDER — IOHEXOL 300 MG/ML  SOLN
100.0000 mL | Freq: Once | INTRAMUSCULAR | Status: AC | PRN
  Administered 2021-11-07: 75 mL via INTRAVENOUS

## 2021-11-07 MED ORDER — SODIUM CHLORIDE 0.9 % IV BOLUS
1000.0000 mL | Freq: Once | INTRAVENOUS | Status: AC
  Administered 2021-11-07: 1000 mL via INTRAVENOUS

## 2021-11-07 NOTE — ED Provider Triage Note (Signed)
Emergency Medicine Provider Triage Evaluation Note ? ?Randy Roberts , a 31 y.o. male  was evaluated in triage.  He was sent over here by PCP for evaluation of acute mastoiditis.  Apparently he has been feeling pretty unwell for about 2 to 3 days.  He has had generalized body aches, a macular rash that has been refractory to benadryl and allegra.  He also states he has left ear pain.  He noted some swelling behind his left ear and swelling into his left side of his neck.  He has pretty bad pain to this area.  He is also had fevers. ? ?Review of Systems  ?Positive:  ?Negative:  ? ?Physical Exam  ?BP 140/88 (BP Location: Right Arm)   Pulse (!) 110   Temp (!) 101.1 ?F (38.4 ?C) (Oral)   Resp 18   SpO2 100%  ?Gen:   Awake, no distress   ?Resp:  Normal effort  ?MSK:   Moves extremities without difficulty  ?Other:  There is swelling and erythema behind the left ear.  There is mastoid tenderness.  TM is difficult to view due to impaction, however the external ear appears erythematous and swollen.  He also has some lymphadenopathy of the anterior chain in the neck.  There is some minimal neck swelling and pain to palpation as well. ? ?Medical Decision Making  ?Medically screening exam initiated at 1:13 PM.  Appropriate orders placed.  Cortney Haugen was informed that the remainder of the evaluation will be completed by another provider, this initial triage assessment does not replace that evaluation, and the importance of remaining in the ED until their evaluation is complete. ? ? ?  ?Claudie Leach, PA-C ?11/07/21 1316 ? ?

## 2021-11-07 NOTE — Progress Notes (Signed)
Randy Roberts ?31 y.o. ? ? ?Chief Complaint  ?Patient presents with  ? knot behind ear  ?  Noticed it Sunday,  swollen, painful , slight fever on Sunday   ? ? ?HISTORY OF PRESENT ILLNESS: ?This is a 31 y.o. male complaining of pain and swelling behind left ear that started several days ago.  Had fever on Sunday. ?Had issues with hives last week.  Much better today. ?No other complaints or medical concerns today. ? ?HPI ? ? ?Prior to Admission medications   ?Medication Sig Start Date End Date Taking? Authorizing Provider  ?benzonatate (TESSALON) 200 MG capsule Take 1 capsule (200 mg total) by mouth 2 (two) times daily as needed for cough. 10/10/21  Yes Trinaty Bundrick, Eilleen KempfMiguel Jose, MD  ?guaiFENesin-codeine [facility](ROBITUSSIN AC) 100-10 MG/5ML syrup Take 5 mLs by mouth 3 (three) times daily as needed for cough. 10/10/21  Yes SagardiaEilleen Kempf, Jah Alarid Jose, MD  ?MILK THISTLE PO Take by mouth.   Yes [provider]  ?Multiple Vitamins-Minerals (MULTIVITAMIN WITH MINERALS) tablet Take 1 tablet by mouth daily.   Yes [provider]  ? ? ?Allergies  ?Allergen Reactions  ? Penicillins Itching  ?  Has patient had a PCN reaction causing immediate rash, facial/tongue/throat swelling, SOB or lightheadedness with hypotension: Yes ?Has patient had a PCN reaction causing severe rash involving mucus membranes or skin necrosis: No ?Has patient had a PCN reaction that required hospitalization: No ?Has patient had a PCN reaction occurring within the last 10 years: No ?If all of the above answers are "NO", then may proceed with Cephalosporin use. ?  ? Tetracyclines & Related Itching  ? ? ?Patient Active Problem List  ? Diagnosis Date Noted  ? COVID-19 virus infection 10/10/2021  ? Acute cough 10/10/2021  ? ? ?Past Medical History:  ?Diagnosis Date  ? Allergy   ? ? ?History reviewed. No pertinent surgical history. ? ?Social History  ? ?Socioeconomic History  ? Marital status: Married  ?  Spouse name: Not on file  ? Number of children: Not on file   ? Years of education: Not on file  ? Highest education level: Not on file  ?Occupational History  ? Not on file  ?Tobacco Use  ? Smoking status: Never  ? Smokeless tobacco: Never  ?Substance and Sexual Activity  ? Alcohol use: No  ? Drug use: No  ? Sexual activity: Never  ?Other Topics Concern  ? Not on file  ?Social History Narrative  ? Not on file  ? ?Social Determinants of Health  ? ?Financial Resource Strain: Not on file  ?Food Insecurity: Not on file  ?Transportation Needs: Not on file  ?Physical Activity: Not on file  ?Stress: Not on file  ?Social Connections: Not on file  ?Intimate Partner Violence: Not on file  ? ? ?History reviewed. No pertinent family history. ? ? ?Review of Systems  ?Constitutional:  Positive for fever. Negative for chills.  ?HENT:  Positive for ear pain. Negative for congestion and sore throat.   ?Respiratory:  Negative for cough and shortness of breath.   ?Cardiovascular:  Negative for chest pain and palpitations.  ?Gastrointestinal:  Negative for abdominal pain, nausea and vomiting.  ?Genitourinary: Negative.  Negative for dysuria and hematuria.  ?Skin:  Positive for rash.  ?Neurological: Negative.  Negative for dizziness and headaches.  ?All other systems reviewed and are negative. ? ?Today's Vitals  ? 11/07/21 1037  ?BP: 110/74  ?Pulse: (!) 107  ?Temp: 99.3 ?F (37.4 ?C)  ?SpO2: 98%  ?  Weight: 106 lb 8 oz (48.3 kg)  ?Height: 5\' 5"  (1.651 m)  ? ?Body mass index is 17.72 kg/m?. ? ?Physical Exam ?Vitals reviewed.  ?Constitutional:   ?   Appearance: Normal appearance.  ?HENT:  ?   Head: Normocephalic.  ?   Left Ear: Swelling present. Tympanic membrane is injected and erythematous.  ?   Ears:  ?   Comments: Left ear lobe protrusion ?Erythematous, tender, and swollen left mastoid process with surrounding tender lymphadenopathy ?   Mouth/Throat:  ?   Mouth: Mucous membranes are moist.  ?   Pharynx: Oropharynx is clear.  ?Eyes:  ?   Extraocular Movements: Extraocular movements intact.  ?    Pupils: Pupils are equal, round, and reactive to light.  ?Cardiovascular:  ?   Rate and Rhythm: Normal rate and regular rhythm.  ?   Pulses: Normal pulses.  ?   Heart sounds: Normal heart sounds.  ?Pulmonary:  ?   Effort: Pulmonary effort is normal.  ?   Breath sounds: Normal breath sounds.  ?Musculoskeletal:  ?   Cervical back: No tenderness.  ?   Right lower leg: No edema.  ?   Left lower leg: No edema.  ?Lymphadenopathy:  ?   Cervical: No cervical adenopathy.  ?Skin: ?   General: Skin is warm and dry.  ?   Capillary Refill: Capillary refill takes less than 2 seconds.  ?Neurological:  ?   General: No focal deficit present.  ?   Mental Status: He is alert and oriented to person, place, and time.  ?Psychiatric:     ?   Mood and Affect: Mood normal.     ?   Behavior: Behavior normal.  ? ? ? ? ? ?ASSESSMENT & PLAN: ?Problem List Items Addressed This Visit   ? ?  ? Nervous and Auditory  ? Acute otitis media  ?  ? Musculoskeletal and Integument  ? Acute mastoiditis of left side - Primary  ?  At risk for meningitis. Needs further evaluation with imaging to rule out abscess formation and bone involvement.  ?Will need IV antibiotics, blood work, and possible hospital admission. ?Referred to the emergency department. ?  ?  ? ?Patient Instructions  ?Diagnosis: Acute mastoiditis.   ?Go to emergency department now for further evaluation and treatment. ?Will need imaging, blood work, and possible admission for IV antibiotics. ? ?Health Maintenance, Male ?Adopting a healthy lifestyle and getting preventive care are important in promoting health and wellness. Ask your health care provider about: ?The right schedule for you to have regular tests and exams. ?Things you can do on your own to prevent diseases and keep yourself healthy. ?What should I know about diet, weight, and exercise? ?Eat a healthy diet ? ?Eat a diet that includes plenty of vegetables, fruits, low-fat dairy products, and lean protein. ?Do not eat a lot of foods  that are high in solid fats, added sugars, or sodium. ?Maintain a healthy weight ?Body mass index (BMI) is a measurement that can be used to identify possible weight problems. It estimates body fat based on height and weight. Your health care provider can help determine your BMI and help you achieve or maintain a healthy weight. ?Get regular exercise ?Get regular exercise. This is one of the most important things you can do for your health. Most adults should: ?Exercise for at least 150 minutes each week. The exercise should increase your heart rate and make you sweat (moderate-intensity exercise). ?Do strengthening exercises at least  twice a week. This is in addition to the moderate-intensity exercise. ?Spend less time sitting. Even light physical activity can be beneficial. ?Watch cholesterol and blood lipids ?Have your blood tested for lipids and cholesterol at 33 years of age, then have this test every 5 years. ?You may need to have your cholesterol levels checked more often if: ?Your lipid or cholesterol levels are high. ?You are older than 33 years of age. ?You are at high risk for heart disease. ?What should I know about cancer screening? ?Many types of cancers can be detected early and may often be prevented. Depending on your health history and family history, you may need to have cancer screening at various ages. This may include screening for: ?Colorectal cancer. ?Prostate cancer. ?Skin cancer. ?Lung cancer. ?What should I know about heart disease, diabetes, and high blood pressure? ?Blood pressure and heart disease ?High blood pressure causes heart disease and increases the risk of stroke. This is more likely to develop in people who have high blood pressure readings or are overweight. ?Talk with your health care provider about your target blood pressure readings. ?Have your blood pressure checked: ?Every 3-5 years if you are 15-34 years of age. ?Every year if you are 36 years old or older. ?If you are  between the ages of 42 and 33 and are a current or former smoker, ask your health care provider if you should have a one-time screening for abdominal aortic aneurysm (AAA). ?Diabetes ?Have regular diabe

## 2021-11-07 NOTE — Assessment & Plan Note (Signed)
At risk for meningitis. Needs further evaluation with imaging to rule out abscess formation and bone involvement.  ?Will need IV antibiotics, blood work, and possible hospital admission. ?Referred to the emergency department. ?

## 2021-11-07 NOTE — Discharge Instructions (Signed)
Please take antibiotic as prescribed for the full duration.  You may take Tylenol or ibuprofen as needed for fever or aches.  Follow-up closely with your primary care doctor in 2 days for recheck.  Return promptly to the ER if your symptoms worsen or if you have other concern. ?

## 2021-11-07 NOTE — ED Provider Notes (Signed)
?[facility] ?Provider Note ? ? ?CSN: [id number] ?Arrival date & time: 11/07/21  1230 ? ?  ? ?History ? ?Chief Complaint  ?Patient presents with  ? Neck Pain  ? ? ?Randy AloeDuy Randy Roberts is a 31 y.o. male. ? ?The history is provided by the patient and medical records. No language interpreter was used.  ?Neck Pain ? ?31 year old male presenting for evaluation of facial swelling.  Patient was initially seen by his PCP and sent here to assess for potential acute mastoiditis.  Patient report a week ago his son was having respiratory distress requiring hospitalization.  He was in the hospital for several days to take care of his son when he developed hives throughout his body.  He tries taking Benadryl with some improvement as well as using over-the-counter Allegra and calamine lotion.  For the past 2 days he noticed pain and swelling about his left ear and left side of neck.  Pain is described as a throbbing achy sensation, and initially he was having trouble turning his neck due to having quite a bit of pain to the lateral aspect of the neck.  That has since improved.  He endorsed having some fever chills and generalized fatigue.  He did recall having some sore throat a week ago as well but that has since resolved.  He finally was able to see his PCP today for his complaint but was sent here for further assessment.  He does not complain of any hearing changes no severe headache no nausea or vomiting no abdominal pain no chest pain.  Endorsing occasional cough but denies any shortness of breath ? ? ? ? ?Home Medications ?Prior to Admission medications   ?Medication Sig Start Date End Date Taking? Authorizing Provider  ?benzonatate (TESSALON) 200 MG capsule Take 1 capsule (200 mg total) by mouth 2 (two) times daily as needed for cough. 10/10/21   Georgina QuintSagardia, Miguel Jose, MD  ?guaiFENesin-codeine [facility](ROBITUSSIN AC) 100-10 MG/5ML syrup Take 5 mLs by mouth 3 (three) times daily as needed for cough.  10/10/21   Georgina QuintSagardia, Miguel Jose, MD  ?MILK THISTLE PO Take by mouth.    [provider]  ?Multiple Vitamins-Minerals (MULTIVITAMIN WITH MINERALS) tablet Take 1 tablet by mouth daily.    [provider]  ?   ? ?Allergies    ?Penicillins, Tetracyclines & related, and Shellfish allergy   ? ?Review of Systems   ?Review of Systems  ?Musculoskeletal:  Positive for neck pain.  ?All other systems reviewed and are negative. ? ?Physical Exam ?Updated Vital Signs ?BP 123/74 (BP Location: Left Arm)   Pulse (!) 117   Temp (!) 100.6 ?F (38.1 ?C) (Oral)   Resp 16   SpO2 100%  ?Physical Exam ?Vitals and nursing note reviewed.  ?Constitutional:   ?   General: He is not in acute distress. ?   Appearance: He is well-developed.  ?   Comments: Patient is resting comfortably in bed appears to be in no acute discomfort.  ?HENT:  ?   Head: Atraumatic.  ?   Ears:  ?   Comments: Left ear: Ear canal is mildly edematous, earlobe is moderately edematous and tender to palpation.  Surrounding skin erythema involving the left side of face as well as to the posterior ear with tenderness to palpation.  Adjacent left anterior cervical chain lymphadenopathy noted ?Eyes:  ?   Extraocular Movements: Extraocular movements intact.  ?   Conjunctiva/sclera: Conjunctivae normal.  ?   Pupils: Pupils are  equal, round, and reactive to light.  ?Cardiovascular:  ?   Rate and Rhythm: Tachycardia present.  ?Pulmonary:  ?   Effort: Pulmonary effort is normal.  ?   Breath sounds: Normal breath sounds.  ?Abdominal:  ?   Palpations: Abdomen is soft.  ?   Tenderness: There is no abdominal tenderness.  ?Musculoskeletal:  ?   Cervical back: Neck supple.  ?Skin: ?   Findings: No rash.  ?Neurological:  ?   Mental Status: He is alert and oriented to person, place, and time.  ? ? ?ED Results / Procedures / Treatments   ?Labs ?(all labs ordered are listed, but only abnormal results are displayed) ?Labs Reviewed  ?COMPREHENSIVE METABOLIC PANEL - Abnormal;  Notable for the following components:  ?    Result Value  ? Glucose, Bld 111 (*)   ? All other components within normal limits  ?CBC WITH DIFFERENTIAL/PLATELET - Abnormal; Notable for the following components:  ? WBC 17.0 (*)   ? Neutro Abs 13.8 (*)   ? Monocytes Absolute 1.2 (*)   ? Abs Immature Granulocytes 0.10 (*)   ? All other components within normal limits  ?RESP PANEL BY RT-PCR (FLU A&B, COVID) [id number]  ?LACTIC ACID, PLASMA  ?URINALYSIS, ROUTINE W REFLEX MICROSCOPIC  ?LACTIC ACID, PLASMA  ? ? ?EKG ?None ? ?Radiology ?CT Soft Tissue Neck W Contrast ? ?Result Date: 11/07/2021 ?CLINICAL DATA:  Provided history: Soft tissue swelling, infection suspected, neck x-ray done. Additional history provided: Patient sent by PCP for evaluation for acute mastoiditis. Generalized body aches, macular rash, left ear pain, swelling behind left ear, swelling in left side of neck, fevers. EXAM: CT TEMPORAL BONES WITH CONTRAST CT NECK WITH CONTRAST TECHNIQUE: Axial and coronal plane CT imaging of the petrous temporal bones was performed with thin-collimation image reconstruction following intravenous contrast administration. Multiplanar CT image reconstructions were also generated. Multidetector CT imaging of the neck was performed using the standard protocol following the bolus administration of intravenous contrast. RADIATION DOSE REDUCTION: This exam was performed according to the departmental dose-optimization program which includes automated exposure control, adjustment of the mA and/or kV according to patient size and/or use of iterative reconstruction technique. CONTRAST:  51mL OMNIPAQUE IOHEXOL 300 MG/ML  SOLN COMPARISON:  None. FINDINGS: CT TEMPORAL BONE FINDINGS: RIGHT TEMPORAL BONE External auditory canal: Patent. Middle ear cavity: Normally aerated. The scutum and ossicles are normal. The tegmen tympani is intact. Inner ear structures: The cochlea, vestibule and semicircular canals are normal. The vestibular aqueduct is  not enlarged. Internal auditory and facial nerve canals:  Unremarkable. Mastoid air cells: Normally aerated. LEFT TEMPORAL BONE External auditory canal: Thickened appearance of the tympanic membrane. This could reflect tympanic membrane thickening or a small amount of debris within the medial external auditory canal, abutting the tympanic membrane (for instance as seen on series 3, image 110). Middle ear cavity: Normally aerated. The scutum and ossicles are normal. The tegmen tympani is intact. Inner ear structures: The cochlea, vestibule and semicircular canals are normal. The vestibular aqueduct is not enlarged. Internal auditory and facial nerve canals:  Unremarkable. Mastoid air cells: Normally aerated. Vascular: Unremarkable appearance of the carotid canals, jugular bulbs and sigmoid plates. Limited intracranial: No evidence of acute intracranial abnormality within the field of view. Visible orbits/paranasal sinuses: Reported below under the CT neck findings section. Soft tissues: Reported below under the CT neck findings section. CT NECK FINDINGS: Pharynx and larynx: No appreciable swelling or discrete mass within the oral cavity, pharynx or larynx.  No retropharyngeal collection. Salivary glands: Mild edema is questioned within the left parotid gland. Unremarkable appearance of the right parotid and bilateral submandibular glands. Thyroid: Unremarkable. Lymph nodes: Cervical lymphadenopathy, predominantly on the left. An index left level 2 lymph node measures 17 mm in short axis (series 4, image 70). Vascular: The major vascular structures of the neck are patent. Limited intracranial: No evidence of acute intracranial abnormality within the field of view. Visualized orbits: No orbital mass or acute orbital finding. Mastoids and visualized paranasal sinuses: Mild mucosal thickening, and small-volume fluid, scattered within the bilateral ethmoid sinuses. Small mucous retention cyst, and mild mucosal thickening,  within the right maxillary sinus. Extensive partial opacification of the left maxillary sinus secondary to the presence of mucosal thickening and fluid. No significant mastoid effusion. Skeleton: Nonspecific r

## 2021-11-07 NOTE — ED Triage Notes (Signed)
Pt here with dx of acute mastoiditis from PCP. Pt reports pain behind L ear and down his neck for the last few days.  ?

## 2021-11-07 NOTE — Patient Instructions (Signed)
Diagnosis: Acute mastoiditis.   ?Go to emergency department now for further evaluation and treatment. ?Will need imaging, blood work, and possible admission for IV antibiotics. ? ?Health Maintenance, Male ?Adopting a healthy lifestyle and getting preventive care are important in promoting health and wellness. Ask your health care provider about: ?The right schedule for you to have regular tests and exams. ?Things you can do on your own to prevent diseases and keep yourself healthy. ?What should I know about diet, weight, and exercise? ?Eat a healthy diet ? ?Eat a diet that includes plenty of vegetables, fruits, low-fat dairy products, and lean protein. ?Do not eat a lot of foods that are high in solid fats, added sugars, or sodium. ?Maintain a healthy weight ?Body mass index (BMI) is a measurement that can be used to identify possible weight problems. It estimates body fat based on height and weight. Your health care provider can help determine your BMI and help you achieve or maintain a healthy weight. ?Get regular exercise ?Get regular exercise. This is one of the most important things you can do for your health. Most adults should: ?Exercise for at least 150 minutes each week. The exercise should increase your heart rate and make you sweat (moderate-intensity exercise). ?Do strengthening exercises at least twice a week. This is in addition to the moderate-intensity exercise. ?Spend less time sitting. Even light physical activity can be beneficial. ?Watch cholesterol and blood lipids ?Have your blood tested for lipids and cholesterol at 33 years of age, then have this test every 5 years. ?You may need to have your cholesterol levels checked more often if: ?Your lipid or cholesterol levels are high. ?You are older than 33 years of age. ?You are at high risk for heart disease. ?What should I know about cancer screening? ?Many types of cancers can be detected early and may often be prevented. Depending on your health  history and family history, you may need to have cancer screening at various ages. This may include screening for: ?Colorectal cancer. ?Prostate cancer. ?Skin cancer. ?Lung cancer. ?What should I know about heart disease, diabetes, and high blood pressure? ?Blood pressure and heart disease ?High blood pressure causes heart disease and increases the risk of stroke. This is more likely to develop in people who have high blood pressure readings or are overweight. ?Talk with your health care provider about your target blood pressure readings. ?Have your blood pressure checked: ?Every 3-5 years if you are 39-30 years of age. ?Every year if you are 81 years old or older. ?If you are between the ages of 20 and 68 and are a current or former smoker, ask your health care provider if you should have a one-time screening for abdominal aortic aneurysm (AAA). ?Diabetes ?Have regular diabetes screenings. This checks your fasting blood sugar level. Have the screening done: ?Once every three years after age 6 if you are at a normal weight and have a low risk for diabetes. ?More often and at a younger age if you are overweight or have a high risk for diabetes. ?What should I know about preventing infection? ?Hepatitis B ?If you have a higher risk for hepatitis B, you should be screened for this virus. Talk with your health care provider to find out if you are at risk for hepatitis B infection. ?Hepatitis C ?Blood testing is recommended for: ?Everyone born from [birth year] through 1967. ?Anyone with known risk factors for hepatitis C. ?Sexually transmitted infections (STIs) ?You should be screened each year for STIs,  including gonorrhea and chlamydia, if: ?You are sexually active and are younger than 33 years of age. ?You are older than 33 years of age and your health care provider tells you that you are at risk for this type of infection. ?Your sexual activity has changed since you were last screened, and you are at increased risk for  chlamydia or gonorrhea. Ask your health care provider if you are at risk. ?Ask your health care provider about whether you are at high risk for HIV. Your health care provider may recommend a prescription medicine to help prevent HIV infection. If you choose to take medicine to prevent HIV, you should first get tested for HIV. You should then be tested every 3 months for as long as you are taking the medicine. ?Follow these instructions at home: ?Alcohol use ?Do not drink alcohol if your health care provider tells you not to drink. ?If you drink alcohol: ?Limit how much you have to 0-2 drinks a day. ?Know how much alcohol is in your drink. In the [place] one drink equals one 12 oz bottle of beer (355 mL), one 5 oz glass of wine (148 mL), or one 1? oz glass of hard liquor (44 mL). ?Lifestyle ?Do not use any products that contain nicotine or tobacco. These products include cigarettes, chewing tobacco, and vaping devices, such as e-cigarettes. If you need help quitting, ask your health care provider. ?Do not use street drugs. ?Do not share needles. ?Ask your health care provider for help if you need support or information about quitting drugs. ?General instructions ?Schedule regular health, dental, and eye exams. ?Stay current with your vaccines. ?Tell your health care provider if: ?You often feel depressed. ?You have ever been abused or do not feel safe at home. ?Summary ?Adopting a healthy lifestyle and getting preventive care are important in promoting health and wellness. ?Follow your health care provider's instructions about healthy diet, exercising, and getting tested or screened for diseases. ?Follow your health care provider's instructions on monitoring your cholesterol and blood pressure. ?This information is not intended to replace advice given to you by your health care provider. Make sure you discuss any questions you have with your health care provider. ?Document Revised: 12/19/2020 Document Reviewed:  12/19/2020 ?Elsevier Patient Education ? 2022 [employer] ? ? ? ? ? ?

## 2021-11-09 ENCOUNTER — Encounter: Payer: Self-pay | Admitting: Emergency Medicine

## 2021-11-09 ENCOUNTER — Ambulatory Visit: Payer: Managed Care, Other (non HMO) | Admitting: Emergency Medicine

## 2021-11-09 VITALS — BP 102/76 | HR 75 | Temp 97.6°F | Ht 65.0 in | Wt 108.4 lb

## 2021-11-09 DIAGNOSIS — L03211 Cellulitis of face: Secondary | ICD-10-CM | POA: Diagnosis not present

## 2021-11-09 NOTE — Patient Instructions (Signed)
Continue and finish antibiotic cefdinir 300 mg twice a day ?Follow-up with me next Tuesday. ? ?Cellulitis, Adult ?Cellulitis is a skin infection. The infected area is often warm, red, swollen, and sore. It occurs most often in the arms and lower legs. It is very important to get treated for this condition. ?What are the causes? ?This condition is caused by bacteria. The bacteria enter through a break in the skin, such as a cut, burn, insect bite, open sore, or crack. ?What increases the risk? ?This condition is more likely to occur in people who: ?Have a weak body defense system (immune system). ?Have open cuts, burns, bites, or scrapes on the skin. ?Are older than 33 years of age. ?Have a blood sugar problem (diabetes). ?Have a long-lasting (chronic) liver disease (cirrhosis) or kidney disease. ?Are very overweight (obese). ?Have a skin problem, such as: ?Itchy rash (eczema). ?Slow movement of blood in the veins (venous stasis). ?Fluid buildup below the skin (edema). ?Have been treated with high-energy rays (radiation). ?Use IV drugs. ?What are the signs or symptoms? ?Symptoms of this condition include: ?Skin that is: ?Red. ?Streaking. ?Spotting. ?Swollen. ?Sore or painful when you touch it. ?Warm. ?A fever. ?Chills. ?Blisters. ?How is this diagnosed? ?This condition is diagnosed based on: ?Medical history. ?Physical exam. ?Blood tests. ?Imaging tests. ?How is this treated? ?Treatment for this condition may include: ?Medicines to treat infections or allergies. ?Home care, such as: ?Rest. ?Placing cold or warm cloths (compresses) on the skin. ?Hospital care, if the condition is very bad. ?Follow these instructions at home: ?Medicines ?Take over-the-counter and prescription medicines only as told by your doctor. ?If you were prescribed an antibiotic medicine, take it as told by your doctor. Do not stop taking it even if you start to feel better. ?General instructions ? ?Drink enough fluid to keep your pee (urine)  pale yellow. ?Do not touch or rub the infected area. ?Raise (elevate) the infected area above the level of your heart while you are sitting or lying down. ?Place cold or warm cloths on the area as told by your doctor. ?Keep all follow-up visits as told by your doctor. This is important. ?Contact a doctor if: ?You have a fever. ?You do not start to get better after 1-2 days of treatment. ?Your bone or joint under the infected area starts to hurt after the skin has healed. ?Your infection comes back. This can happen in the same area or another area. ?You have a swollen bump in the area. ?You have new symptoms. ?You feel ill and have muscle aches and pains. ?Get help right away if: ?Your symptoms get worse. ?You feel very sleepy. ?You throw up (vomit) or have watery poop (diarrhea) for a long time. ?You see red streaks coming from the area. ?Your red area gets larger. ?Your red area turns dark in color. ?These symptoms may represent a serious problem that is an emergency. Do not wait to see if the symptoms will go away. Get medical help right away. Call your local emergency services (911 in the [place] Do not drive yourself to the hospital. ?Summary ?Cellulitis is a skin infection. The area is often warm, red, swollen, and sore. ?This condition is treated with medicines, rest, and cold and warm cloths. ?Take all medicines only as told by your doctor. ?Tell your doctor if symptoms do not start to get better after 1-2 days of treatment. ?This information is not intended to replace advice given to you by your health care provider. Make sure  you discuss any questions you have with your health care provider. ?Document Revised: 12/19/2017 Document Reviewed: 12/19/2017 ?Elsevier Patient Education ? 2022 [employer] ? ?

## 2021-11-09 NOTE — Progress Notes (Signed)
Randy Roberts ?31 y.o. ? ? ?Chief Complaint  ?Patient presents with  ? Follow-up  ?  ER follow up cellulitis of neck and face   ? Diarrhea  ? ? ?HISTORY OF PRESENT ILLNESS: ?This is a 31 y.o. male here for follow-up of ER visit on 11/07/2021. ?Seen by me in the office and referred to the emergency room for possible acute mastoiditis. ?Diagnosis: Facial cellulitis.  Was given IV antibiotics and started on cefdinir 300 mg twice a day. ?Feeling better.  No complications. ? ?Diarrhea  ?Pertinent negatives include no chills, coughing, fever, headaches or vomiting.  ? ? ?Prior to Admission medications   ?Medication Sig Start Date End Date Taking? Authorizing Provider  ?acetaminophen (TYLENOL) 500 MG tablet Take 1 tablet (500 mg total) by mouth every 6 (six) hours as needed. 11/07/21  Yes Fayrene Helper, PA-C  ?benzonatate (TESSALON) 200 MG capsule Take 1 capsule (200 mg total) by mouth 2 (two) times daily as needed for cough. 10/10/21  Yes Chablis Losh, Eilleen Kempf, MD  ?cefdinir (OMNICEF) 300 MG capsule Take 1 capsule (300 mg total) by mouth 2 (two) times daily. 11/07/21  Yes Fayrene Helper, PA-C  ?guaiFENesin-codeine [facility]) 100-10 MG/5ML syrup Take 5 mLs by mouth 3 (three) times daily as needed for cough. 10/10/21  Yes SagardiaEilleen Kempf, MD  ?MILK THISTLE PO Take by mouth.   Yes [provider]  ?Multiple Vitamins-Minerals (MULTIVITAMIN WITH MINERALS) tablet Take 1 tablet by mouth daily.   Yes [provider]  ? ? ?Allergies  ?Allergen Reactions  ? Penicillins Itching  ?  Has patient had a PCN reaction causing immediate rash, facial/tongue/throat swelling, SOB or lightheadedness with hypotension: Yes ?Has patient had a PCN reaction causing severe rash involving mucus membranes or skin necrosis: No ?Has patient had a PCN reaction that required hospitalization: No ?Has patient had a PCN reaction occurring within the last 10 years: No ?If all of the above answers are "NO", then may proceed with Cephalosporin  use. ?  ? Tetracyclines & Related Itching  ? Shellfish Allergy   ? ? ?Patient Active Problem List  ? Diagnosis Date Noted  ? Acute mastoiditis of left side 11/07/2021  ? Acute otitis media 11/07/2021  ? COVID-19 virus infection 10/10/2021  ? Acute cough 10/10/2021  ? ? ?Past Medical History:  ?Diagnosis Date  ? Allergy   ? ? ?No past surgical history on file. ? ?Social History  ? ?Socioeconomic History  ? Marital status: Married  ?  Spouse name: Not on file  ? Number of children: Not on file  ? Years of education: Not on file  ? Highest education level: Not on file  ?Occupational History  ? Not on file  ?Tobacco Use  ? Smoking status: Never  ? Smokeless tobacco: Never  ?Substance and Sexual Activity  ? Alcohol use: No  ? Drug use: No  ? Sexual activity: Never  ?Other Topics Concern  ? Not on file  ?Social History Narrative  ? Not on file  ? ?Social Determinants of Health  ? ?Financial Resource Strain: Not on file  ?Food Insecurity: Not on file  ?Transportation Needs: Not on file  ?Physical Activity: Not on file  ?Stress: Not on file  ?Social Connections: Not on file  ?Intimate Partner Violence: Not on file  ? ? ?No family history on file. ? ? ?Review of Systems  ?Constitutional: Negative.  Negative for chills and fever.  ?HENT: Negative.    ?Respiratory:  Negative for cough  and shortness of breath.   ?Cardiovascular: Negative.  Negative for chest pain and palpitations.  ?Gastrointestinal:  Positive for diarrhea. Negative for nausea and vomiting.  ?Genitourinary: Negative.   ?Skin: Negative.   ?Neurological: Negative.  Negative for dizziness and headaches.  ? ?Today's Vitals  ? 11/09/21 1538  ?BP: 102/76  ?Pulse: 75  ?Temp: 97.6 ?F (36.4 ?C)  ?TempSrc: Oral  ?SpO2: 97%  ?Weight: 108 lb 6 oz (49.2 kg)  ?Height: 5\' 5"  (1.651 m)  ? ?Body mass index is 18.03 kg/m?. ? ?Physical Exam ?Vitals reviewed.  ?Constitutional:   ?   Appearance: Normal appearance.  ?HENT:  ?   Head: Normocephalic.  ?   Ears:  ?   Comments: Area  surrounding left earlobe with less erythema and swelling.  Not as tender to palpation.  Improved from 48 hours ago.  External canal still with erythema and swelling ?Eyes:  ?   Extraocular Movements: Extraocular movements intact.  ?   Pupils: Pupils are equal, round, and reactive to light.  ?Neck:  ?   Comments: Adenopathy improved but still present ?Cardiovascular:  ?   Rate and Rhythm: Normal rate.  ?Pulmonary:  ?   Effort: Pulmonary effort is normal.  ?Skin: ?   General: Skin is warm and dry.  ?   Capillary Refill: Capillary refill takes less than 2 seconds.  ?Neurological:  ?   General: No focal deficit present.  ?   Mental Status: He is alert and oriented to person, place, and time.  ?Psychiatric:     ?   Mood and Affect: Mood normal.     ?   Behavior: Behavior normal.  ? ? ? ?ASSESSMENT & PLAN: ?A total of 35 minutes was spent with the patient and counseling/coordination of care regarding preparing for this visit, review of most recent office visit notes, review of recent ER visit, review of diagnostic imaging, review of most recent blood work results, review of all medications, prognosis, documentation and need for follow-up next Tuesday. ? ?Problem List Items Addressed This Visit   ?None ?Visit Diagnoses   ? ? Facial cellulitis    -  Primary  ? Improving  ? ?  ?Clinically stable and improving.  Advised to continue and finish cefdinir 300 mg twice a day. ?ED precautions given.  Tylenol and or Advil for pain as needed. ?Follow-up with me next Tuesday. ?Patient Instructions  ?Continue and finish antibiotic cefdinir 300 mg twice a day ?Follow-up with me next Tuesday. ? ?Cellulitis, Adult ?Cellulitis is a skin infection. The infected area is often warm, red, swollen, and sore. It occurs most often in the arms and lower legs. It is very important to get treated for this condition. ?What are the causes? ?This condition is caused by bacteria. The bacteria enter through a break in the skin, such as a cut, burn,  insect bite, open sore, or crack. ?What increases the risk? ?This condition is more likely to occur in people who: ?Have a weak body defense system (immune system). ?Have open cuts, burns, bites, or scrapes on the skin. ?Are older than 33 years of age. ?Have a blood sugar problem (diabetes). ?Have a long-lasting (chronic) liver disease (cirrhosis) or kidney disease. ?Are very overweight (obese). ?Have a skin problem, such as: ?Itchy rash (eczema). ?Slow movement of blood in the veins (venous stasis). ?Fluid buildup below the skin (edema). ?Have been treated with high-energy rays (radiation). ?Use IV drugs. ?What are the signs or symptoms? ?Symptoms of this condition  include: ?Skin that is: ?Red. ?Streaking. ?Spotting. ?Swollen. ?Sore or painful when you touch it. ?Warm. ?A fever. ?Chills. ?Blisters. ?How is this diagnosed? ?This condition is diagnosed based on: ?Medical history. ?Physical exam. ?Blood tests. ?Imaging tests. ?How is this treated? ?Treatment for this condition may include: ?Medicines to treat infections or allergies. ?Home care, such as: ?Rest. ?Placing cold or warm cloths (compresses) on the skin. ?Hospital care, if the condition is very bad. ?Follow these instructions at home: ?Medicines ?Take over-the-counter and prescription medicines only as told by your doctor. ?If you were prescribed an antibiotic medicine, take it as told by your doctor. Do not stop taking it even if you start to feel better. ?General instructions ? ?Drink enough fluid to keep your pee (urine) pale yellow. ?Do not touch or rub the infected area. ?Raise (elevate) the infected area above the level of your heart while you are sitting or lying down. ?Place cold or warm cloths on the area as told by your doctor. ?Keep all follow-up visits as told by your doctor. This is important. ?Contact a doctor if: ?You have a fever. ?You do not start to get better after 1-2 days of treatment. ?Your bone or joint under the infected area starts  to hurt after the skin has healed. ?Your infection comes back. This can happen in the same area or another area. ?You have a swollen bump in the area. ?You have new symptoms. ?You feel ill and have muscle ache

## 2021-11-14 ENCOUNTER — Encounter: Payer: Self-pay | Admitting: Emergency Medicine

## 2021-11-14 ENCOUNTER — Ambulatory Visit: Payer: Managed Care, Other (non HMO) | Admitting: Emergency Medicine

## 2021-11-14 VITALS — BP 100/72 | HR 94 | Temp 98.2°F | Ht 65.0 in | Wt 107.1 lb

## 2021-11-14 DIAGNOSIS — L03211 Cellulitis of face: Secondary | ICD-10-CM | POA: Diagnosis not present

## 2021-11-14 NOTE — Patient Instructions (Signed)
Continue and finish antibiotic. ?Follow-up in the office if no better or worse during the next couple weeks. ?Health Maintenance, Male ?Adopting a healthy lifestyle and getting preventive care are important in promoting health and wellness. Ask your health care provider about: ?The right schedule for you to have regular tests and exams. ?Things you can do on your own to prevent diseases and keep yourself healthy. ?What should I know about diet, weight, and exercise? ?Eat a healthy diet ? ?Eat a diet that includes plenty of vegetables, fruits, low-fat dairy products, and lean protein. ?Do not eat a lot of foods that are high in solid fats, added sugars, or sodium. ?Maintain a healthy weight ?Body mass index (BMI) is a measurement that can be used to identify possible weight problems. It estimates body fat based on height and weight. Your health care provider can help determine your BMI and help you achieve or maintain a healthy weight. ?Get regular exercise ?Get regular exercise. This is one of the most important things you can do for your health. Most adults should: ?Exercise for at least 150 minutes each week. The exercise should increase your heart rate and make you sweat (moderate-intensity exercise). ?Do strengthening exercises at least twice a week. This is in addition to the moderate-intensity exercise. ?Spend less time sitting. Even light physical activity can be beneficial. ?Watch cholesterol and blood lipids ?Have your blood tested for lipids and cholesterol at 33 years of age, then have this test every 5 years. ?You may need to have your cholesterol levels checked more often if: ?Your lipid or cholesterol levels are high. ?You are older than 33 years of age. ?You are at high risk for heart disease. ?What should I know about cancer screening? ?Many types of cancers can be detected early and may often be prevented. Depending on your health history and family history, you may need to have cancer screening at  various ages. This may include screening for: ?Colorectal cancer. ?Prostate cancer. ?Skin cancer. ?Lung cancer. ?What should I know about heart disease, diabetes, and high blood pressure? ?Blood pressure and heart disease ?High blood pressure causes heart disease and increases the risk of stroke. This is more likely to develop in people who have high blood pressure readings or are overweight. ?Talk with your health care provider about your target blood pressure readings. ?Have your blood pressure checked: ?Every 3-5 years if you are 65-84 years of age. ?Every year if you are 27 years old or older. ?If you are between the ages of 85 and 66 and are a current or former smoker, ask your health care provider if you should have a one-time screening for abdominal aortic aneurysm (AAA). ?Diabetes ?Have regular diabetes screenings. This checks your fasting blood sugar level. Have the screening done: ?Once every three years after age 26 if you are at a normal weight and have a low risk for diabetes. ?More often and at a younger age if you are overweight or have a high risk for diabetes. ?What should I know about preventing infection? ?Hepatitis B ?If you have a higher risk for hepatitis B, you should be screened for this virus. Talk with your health care provider to find out if you are at risk for hepatitis B infection. ?Hepatitis C ?Blood testing is recommended for: ?Everyone born from [birth year] through 1967. ?Anyone with known risk factors for hepatitis C. ?Sexually transmitted infections (STIs) ?You should be screened each year for STIs, including gonorrhea and chlamydia, if: ?You are sexually active  and are younger than 33 years of age. ?You are older than 33 years of age and your health care provider tells you that you are at risk for this type of infection. ?Your sexual activity has changed since you were last screened, and you are at increased risk for chlamydia or gonorrhea. Ask your health care provider if you are at  risk. ?Ask your health care provider about whether you are at high risk for HIV. Your health care provider may recommend a prescription medicine to help prevent HIV infection. If you choose to take medicine to prevent HIV, you should first get tested for HIV. You should then be tested every 3 months for as long as you are taking the medicine. ?Follow these instructions at home: ?Alcohol use ?Do not drink alcohol if your health care provider tells you not to drink. ?If you drink alcohol: ?Limit how much you have to 0-2 drinks a day. ?Know how much alcohol is in your drink. In the [place] one drink equals one 12 oz bottle of beer (355 mL), one 5 oz glass of wine (148 mL), or one 1? oz glass of hard liquor (44 mL). ?Lifestyle ?Do not use any products that contain nicotine or tobacco. These products include cigarettes, chewing tobacco, and vaping devices, such as e-cigarettes. If you need help quitting, ask your health care provider. ?Do not use street drugs. ?Do not share needles. ?Ask your health care provider for help if you need support or information about quitting drugs. ?General instructions ?Schedule regular health, dental, and eye exams. ?Stay current with your vaccines. ?Tell your health care provider if: ?You often feel depressed. ?You have ever been abused or do not feel safe at home. ?Summary ?Adopting a healthy lifestyle and getting preventive care are important in promoting health and wellness. ?Follow your health care provider's instructions about healthy diet, exercising, and getting tested or screened for diseases. ?Follow your health care provider's instructions on monitoring your cholesterol and blood pressure. ?This information is not intended to replace advice given to you by your health care provider. Make sure you discuss any questions you have with your health care provider. ?Document Revised: 12/19/2020 Document Reviewed: 12/19/2020 ?Elsevier Patient Education ? [employer]. ? ?

## 2021-11-14 NOTE — Progress Notes (Signed)
Randy Roberts ?31 y.o. ? ? ?Chief Complaint  ?Patient presents with  ? Follow-up  ?  No concerns  ? ? ?HISTORY OF PRESENT ILLNESS: ?This is a 31 y.o. male here for follow-up of periauricular cellulitis. ?Doing much better.  Still taking antibiotic.  Has about 3 or 4 days left. ?No new concerns or new complaints today. ? ?HPI ? ? ?Prior to Admission medications   ?Medication Sig Start Date End Date Taking? Authorizing Provider  ?acetaminophen (TYLENOL) 500 MG tablet Take 1 tablet (500 mg total) by mouth every 6 (six) hours as needed. 11/07/21  Yes Fayrene Helper, PA-C  ?benzonatate (TESSALON) 200 MG capsule Take 1 capsule (200 mg total) by mouth 2 (two) times daily as needed for cough. 10/10/21  Yes Zykerria Tanton, Eilleen Kempf, MD  ?cefdinir (OMNICEF) 300 MG capsule Take 1 capsule (300 mg total) by mouth 2 (two) times daily. 11/07/21  Yes Fayrene Helper, PA-C  ?guaiFENesin-codeine [facility]) 100-10 MG/5ML syrup Take 5 mLs by mouth 3 (three) times daily as needed for cough. 10/10/21  Yes SagardiaEilleen Kempf, MD  ?MILK THISTLE PO Take by mouth.   Yes [provider]  ?Multiple Vitamins-Minerals (MULTIVITAMIN WITH MINERALS) tablet Take 1 tablet by mouth daily.   Yes [provider]  ? ? ?Allergies  ?Allergen Reactions  ? Penicillins Itching  ?  Has patient had a PCN reaction causing immediate rash, facial/tongue/throat swelling, SOB or lightheadedness with hypotension: Yes ?Has patient had a PCN reaction causing severe rash involving mucus membranes or skin necrosis: No ?Has patient had a PCN reaction that required hospitalization: No ?Has patient had a PCN reaction occurring within the last 10 years: No ?If all of the above answers are "NO", then may proceed with Cephalosporin use. ?  ? Tetracyclines & Related Itching  ? Shellfish Allergy   ? ? ?Patient Active Problem List  ? Diagnosis Date Noted  ? Acute mastoiditis of left side 11/07/2021  ? Acute otitis media 11/07/2021  ? COVID-19 virus infection 10/10/2021   ? Acute cough 10/10/2021  ? ? ?Past Medical History:  ?Diagnosis Date  ? Allergy   ? ? ?No past surgical history on file. ? ?Social History  ? ?Socioeconomic History  ? Marital status: Married  ?  Spouse name: Not on file  ? Number of children: Not on file  ? Years of education: Not on file  ? Highest education level: Not on file  ?Occupational History  ? Not on file  ?Tobacco Use  ? Smoking status: Never  ? Smokeless tobacco: Never  ?Substance and Sexual Activity  ? Alcohol use: No  ? Drug use: No  ? Sexual activity: Never  ?Other Topics Concern  ? Not on file  ?Social History Narrative  ? Not on file  ? ?Social Determinants of Health  ? ?Financial Resource Strain: Not on file  ?Food Insecurity: Not on file  ?Transportation Needs: Not on file  ?Physical Activity: Not on file  ?Stress: Not on file  ?Social Connections: Not on file  ?Intimate Partner Violence: Not on file  ? ? ?No family history on file. ? ? ?Review of Systems  ?Constitutional: Negative.  Negative for chills and fever.  ?HENT: Negative.  Negative for congestion and sore throat.   ?Respiratory: Negative.  Negative for cough and shortness of breath.   ?Cardiovascular: Negative.  Negative for chest pain and palpitations.  ?Gastrointestinal:  Negative for abdominal pain, diarrhea, nausea and vomiting.  ?Skin: Negative.  Negative for rash.  ?  Neurological:  Negative for dizziness and headaches.  ?All other systems reviewed and are negative. ? ?Today's Vitals  ? 11/14/21 0955  ?BP: 100/72  ?Pulse: 94  ?Temp: 98.2 ?F (36.8 ?C)  ?TempSrc: Oral  ?SpO2: 97%  ?Weight: 107 lb 2 oz (48.6 kg)  ?Height: 5\' 5"  (1.651 m)  ? ?Body mass index is 17.83 kg/m?. ? ?Physical Exam ?Vitals reviewed.  ?Constitutional:   ?   Appearance: Normal appearance.  ?HENT:  ?   Head: Normocephalic.  ?   Ears:  ?   Comments: Much improved periauricular cellulitis.  No erythema.  No tenderness.  Minimal swelling. ?Eyes:  ?   Extraocular Movements: Extraocular movements intact.  ?   Pupils:  Pupils are equal, round, and reactive to light.  ?Cardiovascular:  ?   Rate and Rhythm: Normal rate.  ?Pulmonary:  ?   Effort: Pulmonary effort is normal.  ?Musculoskeletal:  ?   Cervical back: No tenderness.  ?Lymphadenopathy:  ?   Cervical: No cervical adenopathy.  ?Skin: ?   General: Skin is warm and dry.  ?   Capillary Refill: Capillary refill takes less than 2 seconds.  ?Neurological:  ?   General: No focal deficit present.  ?   Mental Status: He is alert and oriented to person, place, and time.  ?Psychiatric:     ?   Mood and Affect: Mood normal.     ?   Behavior: Behavior normal.  ? ? ? ?ASSESSMENT & PLAN: ?Problem List Items Addressed This Visit   ?None ?Visit Diagnoses   ? ? Facial cellulitis    -  Primary  ? Much improved  ? ?  ? ?Patient Instructions  ?Continue and finish antibiotic. ?Follow-up in the office if no better or worse during the next couple weeks. ?Health Maintenance, Male ?Adopting a healthy lifestyle and getting preventive care are important in promoting health and wellness. Ask your health care provider about: ?The right schedule for you to have regular tests and exams. ?Things you can do on your own to prevent diseases and keep yourself healthy. ?What should I know about diet, weight, and exercise? ?Eat a healthy diet ? ?Eat a diet that includes plenty of vegetables, fruits, low-fat dairy products, and lean protein. ?Do not eat a lot of foods that are high in solid fats, added sugars, or sodium. ?Maintain a healthy weight ?Body mass index (BMI) is a measurement that can be used to identify possible weight problems. It estimates body fat based on height and weight. Your health care provider can help determine your BMI and help you achieve or maintain a healthy weight. ?Get regular exercise ?Get regular exercise. This is one of the most important things you can do for your health. Most adults should: ?Exercise for at least 150 minutes each week. The exercise should increase your heart rate  and make you sweat (moderate-intensity exercise). ?Do strengthening exercises at least twice a week. This is in addition to the moderate-intensity exercise. ?Spend less time sitting. Even light physical activity can be beneficial. ?Watch cholesterol and blood lipids ?Have your blood tested for lipids and cholesterol at 33 years of age, then have this test every 5 years. ?You may need to have your cholesterol levels checked more often if: ?Your lipid or cholesterol levels are high. ?You are older than 33 years of age. ?You are at high risk for heart disease. ?What should I know about cancer screening? ?Many types of cancers can be detected early and may  often be prevented. Depending on your health history and family history, you may need to have cancer screening at various ages. This may include screening for: ?Colorectal cancer. ?Prostate cancer. ?Skin cancer. ?Lung cancer. ?What should I know about heart disease, diabetes, and high blood pressure? ?Blood pressure and heart disease ?High blood pressure causes heart disease and increases the risk of stroke. This is more likely to develop in people who have high blood pressure readings or are overweight. ?Talk with your health care provider about your target blood pressure readings. ?Have your blood pressure checked: ?Every 3-5 years if you are 65-25 years of age. ?Every year if you are 34 years old or older. ?If you are between the ages of 33 and 19 and are a current or former smoker, ask your health care provider if you should have a one-time screening for abdominal aortic aneurysm (AAA). ?Diabetes ?Have regular diabetes screenings. This checks your fasting blood sugar level. Have the screening done: ?Once every three years after age 76 if you are at a normal weight and have a low risk for diabetes. ?More often and at a younger age if you are overweight or have a high risk for diabetes. ?What should I know about preventing infection? ?Hepatitis B ?If you have a  higher risk for hepatitis B, you should be screened for this virus. Talk with your health care provider to find out if you are at risk for hepatitis B infection. ?Hepatitis C ?Blood testing is recommended

## 2021-11-28 ENCOUNTER — Encounter: Payer: Self-pay | Admitting: Emergency Medicine

## 2021-11-28 ENCOUNTER — Ambulatory Visit: Payer: Managed Care, Other (non HMO) | Admitting: Emergency Medicine

## 2021-11-28 VITALS — BP 98/72 | HR 72 | Temp 97.9°F | Ht 65.0 in | Wt 106.5 lb

## 2021-11-28 DIAGNOSIS — L509 Urticaria, unspecified: Secondary | ICD-10-CM

## 2021-11-28 NOTE — Patient Instructions (Signed)
Hives Hives are itchy, red, swollen areas on your skin. Hives can show up on any part of your body. Hives often fade within 24 hours (acute hives). New hives can show up after old ones fade. This can go on for many days or weeks (chronic hives). Hives do not spread from person to person (are not contagious). Hives are caused by your body's response to something that you are allergic to (allergen). These are sometimes called triggers. You can get hives right after being around a trigger, or hours later. What are the causes? Allergies to foods. Insect bites or stings. Exposure to pollen or pets. Spending time in sunlight, heat, or cold. Exercise. Stress. You can also get hives from other medical conditions and treatments, such as: Some medicines. Chemicals or latex. Viruses. This includes the common cold. Infections caused by germs (bacteria). Allergy shots. Blood transfusions. Sometimes, the cause is not known. What increases the risk? Being a woman. Being allergic to foods such as: Citrus fruits. Milk. Eggs. Peanuts. Tree nuts. Shellfish. Being allergic to: Medicines. Latex. Insects. Animals. Pollen. What are the signs or symptoms?  Raised, itchy, red or white bumps or patches on your skin. These areas may: Get large and swollen. Change in shape and location. Stand alone or connect to each other over a large area of skin. Sting or hurt. Turn white when pressed in the center (blanch). In very bad cases, your hands, feet, and face may also get swollen. This may happen if hives start deeper in your skin. How is this treated? Treatment for this condition depends on your symptoms. Treatment may include: Using cool, wet cloths (cool compresses) or taking cool showers to stop the itching. Medicines that help: Relieve itching (antihistamines). Reduce swelling (corticosteroids). Treat infection (antibiotics). A medicine (omalizumab) that is given as a shot (injection). Your  doctor may prescribe this if you have hives that do not get better even after other treatments. In very bad cases, you may need a shot of a medicine called epinephrine to prevent a life-threatening allergic reaction (anaphylaxis). Follow these instructions at home: Medicines Take or apply over-the-counter and prescription medicines only as told by your doctor. If you were prescribed an antibiotic medicine, use it as told by your doctor. Do not stop using it even if you start to feel better. Skin care Apply cool, wet cloths to the hives. Do not scratch your skin. Do not rub your skin. General instructions Do not take hot showers or baths. This can make itching worse. Do not wear tight clothes. Use sunscreen and wear clothes that cover your skin when you are outside. Avoid any triggers that cause your hives. Keep a journal to help track what causes your hives. Write down: What medicines you take. What you eat and drink. What products you use on your skin. Keep all follow-up visits as told by your doctor. This is important. Contact a doctor if: Your symptoms are not better with medicine. Your joints hurt or are swollen. Get help right away if: You have a fever. You have pain in your belly (abdomen). Your tongue or lips are swollen. Your eyelids are swollen. Your chest or throat feels tight. You have trouble breathing or swallowing. These symptoms may be an emergency. Do not wait to see if the symptoms will go away. Get medical help right away. Call your local emergency services (911 in the [place] Do not drive yourself to the hospital. Summary Hives are itchy, red, swollen areas on your skin. Treatment   for this condition depends on your symptoms. Avoid things that cause your hives. Keep a journal to help track what causes your hives. Take and apply over-the-counter and prescription medicines only as told by your doctor. Get help right away if your chest or throat feels tight or if you  have trouble breathing or swallowing. This information is not intended to replace advice given to you by your health care provider. Make sure you discuss any questions you have with your health care provider. Document Revised: 09/16/2020 Document Reviewed: 09/18/2020 Elsevier Patient Education  2023 [employer]  

## 2021-11-28 NOTE — Progress Notes (Signed)
Randy Roberts ?31 y.o. ? ? ?Chief Complaint  ?Patient presents with  ? Rash  ?  Rash all over body after taking ABX   ? ? ?HISTORY OF PRESENT ILLNESS: ?This is a 31 y.o. male complaining of intermittent diffuse rash, last time last week. ?It started the last 2 days of taking antibiotics.  Rash no longer there. ?Picture patient showed me looks like hives. ?No other complaints or medical concerns today. ? ?Rash ? ? ? ?Prior to Admission medications   ?Medication Sig Start Date End Date Taking? Authorizing Provider  ?acetaminophen (TYLENOL) 500 MG tablet Take 1 tablet (500 mg total) by mouth every 6 (six) hours as needed. 11/07/21  Yes Domenic Moras, PA-C  ?MILK THISTLE PO Take by mouth.   Yes [provider]  ?Multiple Vitamins-Minerals (MULTIVITAMIN WITH MINERALS) tablet Take 1 tablet by mouth daily.   Yes [provider]  ?benzonatate (TESSALON) 200 MG capsule Take 1 capsule (200 mg total) by mouth 2 (two) times daily as needed for cough. ?Patient not taking: Reported on 11/28/2021 10/10/21   Horald Pollen, MD  ?cefdinir (OMNICEF) 300 MG capsule Take 1 capsule (300 mg total) by mouth 2 (two) times daily. ?Patient not taking: Reported on 11/28/2021 11/07/21   Domenic Moras, PA-C  ?guaiFENesin-codeine [facility]) 100-10 MG/5ML syrup Take 5 mLs by mouth 3 (three) times daily as needed for cough. ?Patient not taking: Reported on 11/28/2021 10/10/21   Horald Pollen, MD  ? ? ?Allergies  ?Allergen Reactions  ? Penicillins Itching  ?  Has patient had a PCN reaction causing immediate rash, facial/tongue/throat swelling, SOB or lightheadedness with hypotension: Yes ?Has patient had a PCN reaction causing severe rash involving mucus membranes or skin necrosis: No ?Has patient had a PCN reaction that required hospitalization: No ?Has patient had a PCN reaction occurring within the last 10 years: No ?If all of the above answers are "NO", then may proceed with Cephalosporin use. ?  ? Tetracyclines &  Related Itching  ? Shellfish Allergy   ? ? ?Patient Active Problem List  ? Diagnosis Date Noted  ? Acute mastoiditis of left side 11/07/2021  ? Acute otitis media 11/07/2021  ? COVID-19 virus infection 10/10/2021  ? Acute cough 10/10/2021  ? ? ?Past Medical History:  ?Diagnosis Date  ? Allergy   ? ? ?No past surgical history on file. ? ?Social History  ? ?Socioeconomic History  ? Marital status: Married  ?  Spouse name: Not on file  ? Number of children: Not on file  ? Years of education: Not on file  ? Highest education level: Not on file  ?Occupational History  ? Not on file  ?Tobacco Use  ? Smoking status: Never  ? Smokeless tobacco: Never  ?Substance and Sexual Activity  ? Alcohol use: No  ? Drug use: No  ? Sexual activity: Never  ?Other Topics Concern  ? Not on file  ?Social History Narrative  ? Not on file  ? ?Social Determinants of Health  ? ?Financial Resource Strain: Not on file  ?Food Insecurity: Not on file  ?Transportation Needs: Not on file  ?Physical Activity: Not on file  ?Stress: Not on file  ?Social Connections: Not on file  ?Intimate Partner Violence: Not on file  ? ? ?No family history on file. ? ? ?Review of Systems  ?Constitutional: Negative.   ?HENT: Negative.    ?Cardiovascular: Negative.   ?Gastrointestinal: Negative.   ?Genitourinary: Negative.   ?Skin:  Positive for  rash.  ?Neurological: Negative.   ? ? ?Physical Exam ?Vitals reviewed.  ?Constitutional:   ?   Appearance: Normal appearance.  ?HENT:  ?   Head: Normocephalic.  ?Eyes:  ?   Extraocular Movements: Extraocular movements intact.  ?   Pupils: Pupils are equal, round, and reactive to light.  ?Cardiovascular:  ?   Rate and Rhythm: Normal rate.  ?   Pulses: Normal pulses.  ?   Heart sounds: Normal heart sounds.  ?Pulmonary:  ?   Effort: Pulmonary effort is normal.  ?   Breath sounds: Normal breath sounds.  ?Musculoskeletal:  ?   Cervical back: No tenderness.  ?   Right lower leg: No edema.  ?   Left lower leg: No edema.   ?Lymphadenopathy:  ?   Cervical: No cervical adenopathy.  ?Skin: ?   General: Skin is warm and dry.  ?   Capillary Refill: Capillary refill takes less than 2 seconds.  ?   Findings: No rash.  ?Neurological:  ?   General: No focal deficit present.  ?   Mental Status: He is alert and oriented to person, place, and time.  ?Psychiatric:     ?   Mood and Affect: Mood normal.     ?   Behavior: Behavior normal.  ? ? ? ?ASSESSMENT & PLAN: ?Clinically stable.  Advised to take over-the-counter Allegra for hives as needed. ?No red flag signs or symptoms.  Asymptomatic today. ?We will refer to allergist if it turns into a chronic intermittent problem. ?Problem List Items Addressed This Visit   ?None ?Visit Diagnoses   ? ? Hives    -  Primary  ? Resolved  ? ?  ? ?Patient Instructions  ?Hives ?Hives are itchy, red, swollen areas on your skin. Hives can show up on any part of your body. Hives often fade within 24 hours (acute hives). New hives can show up after old ones fade. This can go on for many days or weeks (chronic hives). Hives do not spread from person to person (are not contagious). ?Hives are caused by your body's response to something that you are allergic to (allergen). These are sometimes called triggers. You can get hives right after being around a trigger, or hours later. ?What are the causes? ?Allergies to foods. ?Insect bites or stings. ?Exposure to pollen or pets. ?Spending time in sunlight, heat, or cold. ?Exercise. ?Stress. ?You can also get hives from other medical conditions and treatments, such as: ?Some medicines. ?Chemicals or latex. ?Viruses. This includes the common cold. ?Infections caused by germs (bacteria). ?Allergy shots. ?Blood transfusions. ?Sometimes, the cause is not known. ?What increases the risk? ?Being a woman. ?Being allergic to foods such as: ?Citrus fruits. ?Milk. ?Eggs. ?Peanuts. ?Tree nuts. ?Shellfish. ?Being allergic to: ?Medicines. ?Latex. ?Insects. ?Animals. ?Pollen. ?What are the  signs or symptoms? ? ?Raised, itchy, red or white bumps or patches on your skin. These areas may: ?Get large and swollen. ?Change in shape and location. ?Stand alone or connect to each other over a large area of skin. ?Sting or hurt. ?Turn white when pressed in the center (blanch). ?In very bad cases, your hands, feet, and face may also get swollen. This may happen if hives start deeper in your skin. ?How is this treated? ?Treatment for this condition depends on your symptoms. Treatment may include: ?Using cool, wet cloths (cool compresses) or taking cool showers to stop the itching. ?Medicines that help: ?Relieve itching (antihistamines). ?Reduce swelling (corticosteroids). ?Treat infection (antibiotics). ?  A medicine (omalizumab) that is given as a shot (injection). Your doctor may prescribe this if you have hives that do not get better even after other treatments. ?In very bad cases, you may need a shot of a medicine called epinephrine to prevent a life-threatening allergic reaction (anaphylaxis). ?Follow these instructions at home: ?Medicines ?Take or apply over-the-counter and prescription medicines only as told by your doctor. ?If you were prescribed an antibiotic medicine, use it as told by your doctor. Do not stop using it even if you start to feel better. ?Skin care ?Apply cool, wet cloths to the hives. ?Do not scratch your skin. Do not rub your skin. ?General instructions ?Do not take hot showers or baths. This can make itching worse. ?Do not wear tight clothes. ?Use sunscreen and wear clothes that cover your skin when you are outside. ?Avoid any triggers that cause your hives. Keep a journal to help track what causes your hives. Write down: ?What medicines you take. ?What you eat and drink. ?What products you use on your skin. ?Keep all follow-up visits as told by your doctor. This is important. ?Contact a doctor if: ?Your symptoms are not better with medicine. ?Your joints hurt or are swollen. ?Get help  right away if: ?You have a fever. ?You have pain in your belly (abdomen). ?Your tongue or lips are swollen. ?Your eyelids are swollen. ?Your chest or throat feels tight. ?You have trouble breathing or swallowing. ?These sy

## 2022-04-24 IMAGING — CT CT NECK W/ CM
3 of 4 series · 10 of 33 positions shown, 12 images · IV contrast (Omni 300)
Comparison: None.

CLINICAL DATA: Provided history: Soft tissue swelling, infection
suspected, neck x-ray done. Additional history provided: Patient
sent by PCP for evaluation for acute mastoiditis. Generalized body
aches, macular rash, left ear pain, swelling behind left ear,
swelling in left side of neck, fevers.

EXAM:
CT TEMPORAL BONES WITH CONTRAST
CT NECK WITH CONTRAST
TECHNIQUE: Axial and coronal plane CT imaging of the petrous temporal bones was
performed with thin-collimation image reconstruction following
intravenous contrast administration. Multiplanar CT image
reconstructions were also generated.

[Series 4: sagittal · sagittal · 0.43mm/px · 5 of 101 slices shown, 6 images]
[im 34/101  bone]
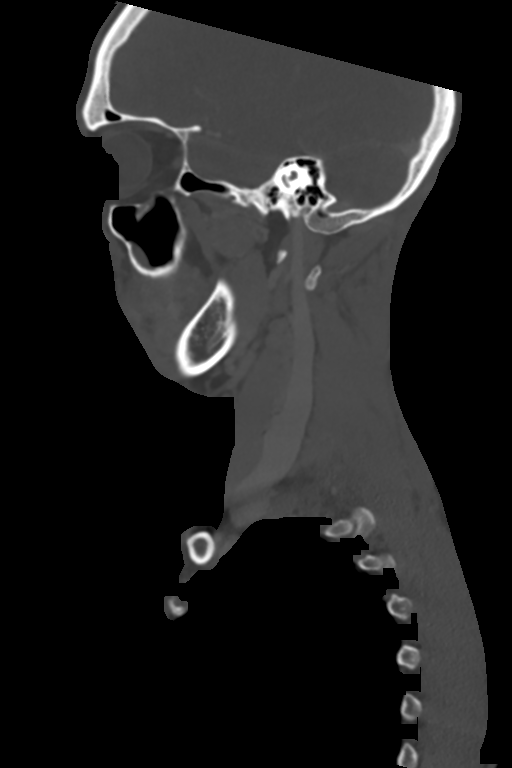
[im 42/101  bone]
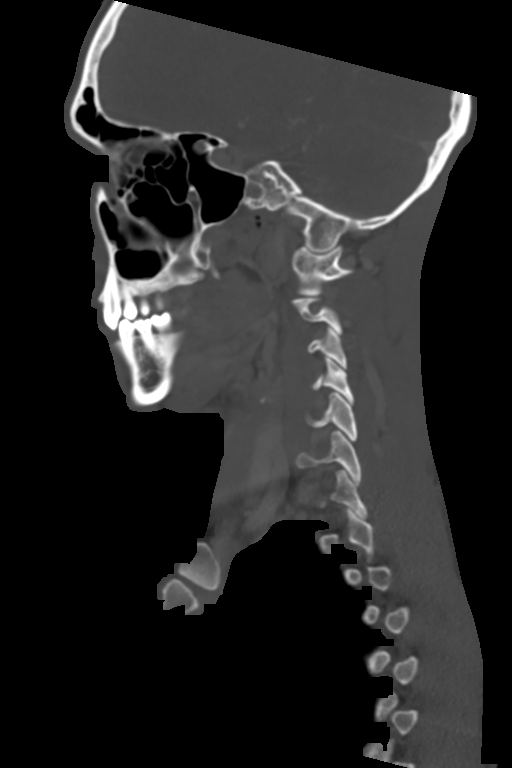
[im 51/101  soft-tissue]
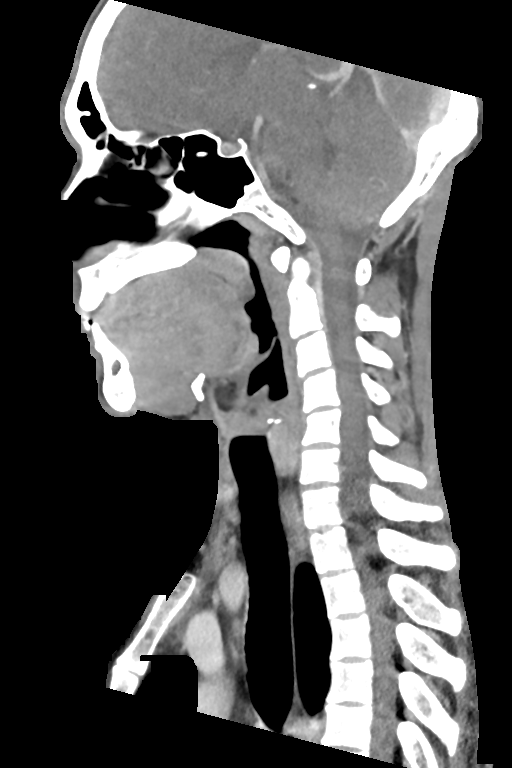
[im 51/101  bone]
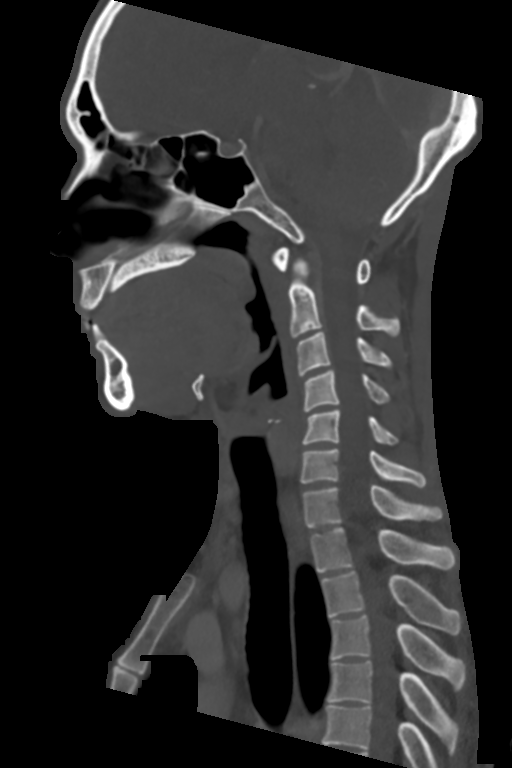
[im 59/101  bone]
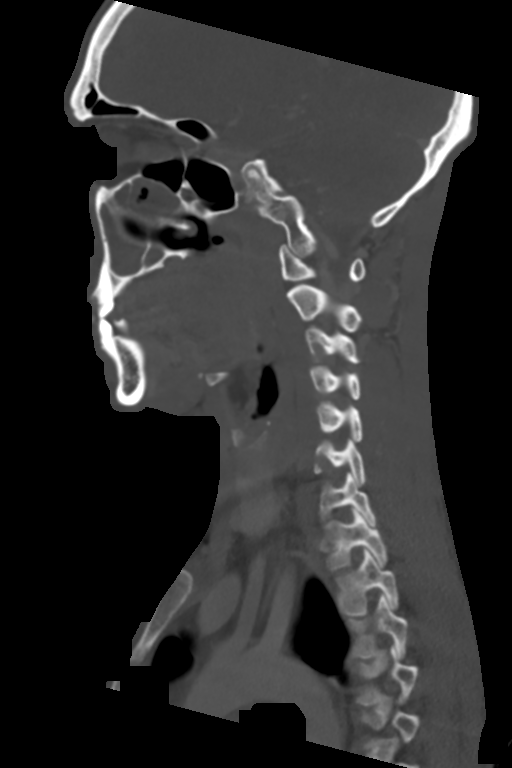
[im 67/101  bone]
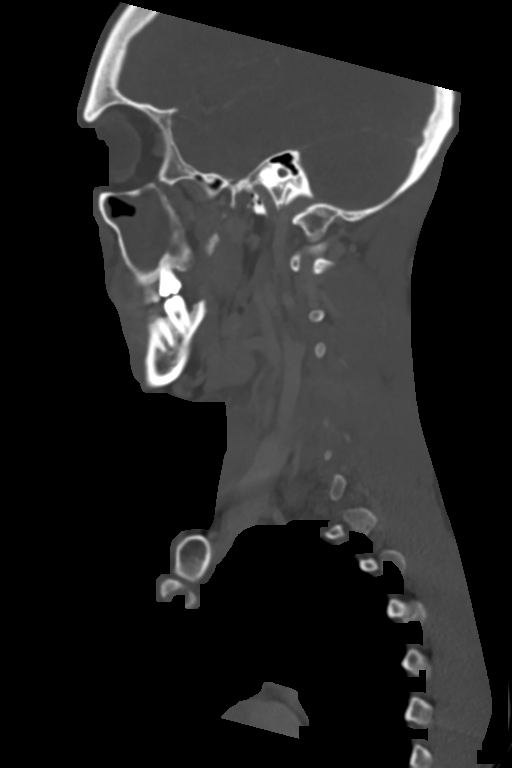

[Series 5: coronal · coronal · 0.43mm/px · 3 of 106 slices shown]
[im 29/106  bone]
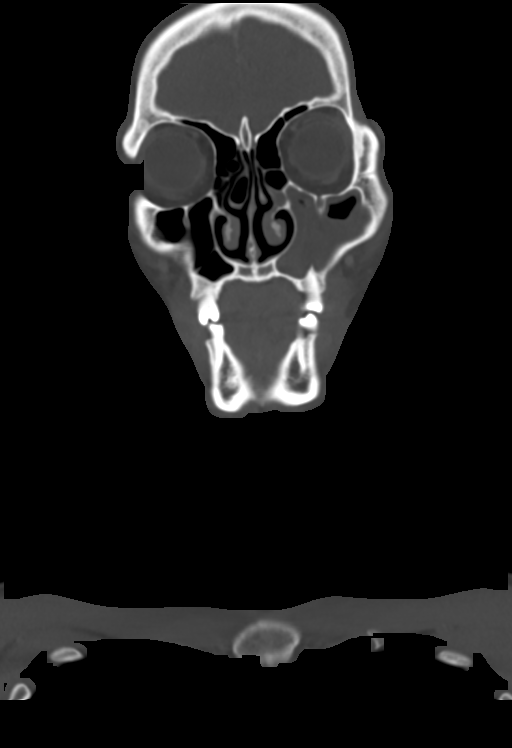
[im 45/106  bone]
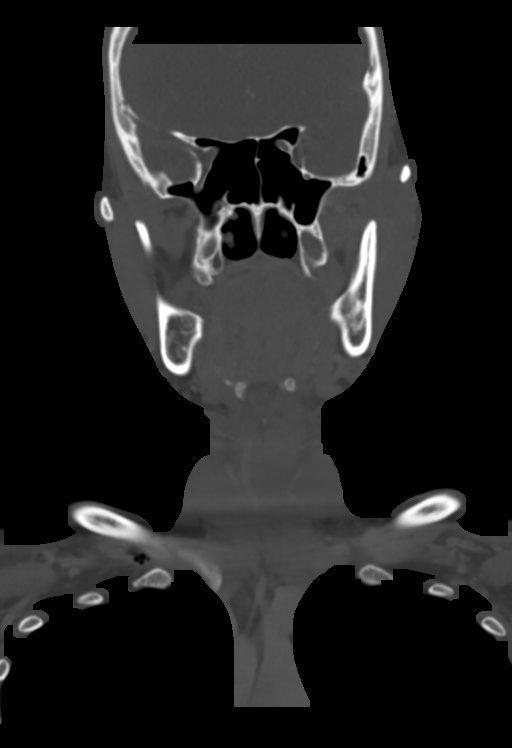
[im 61/106  bone]
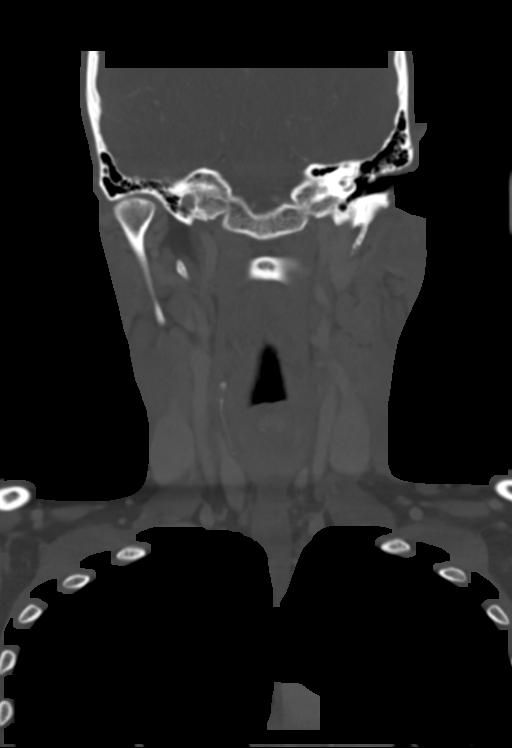

[Series 6: orthogonal · axial · 0.39mm/px · z∈[-195,-92]mm · 2 of 161 slices shown, 3 images]
[im 54/161  soft-tissue]
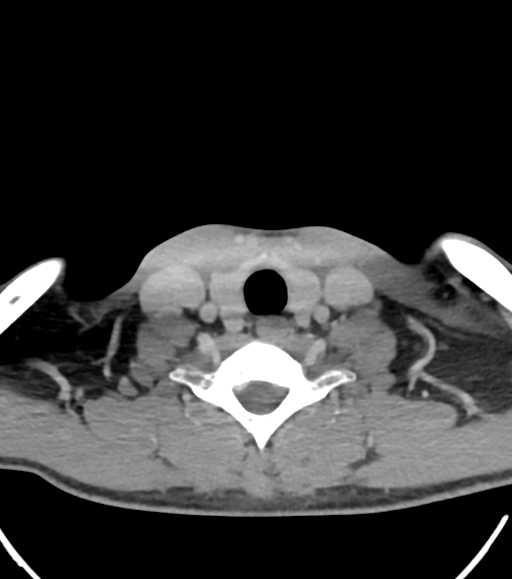
[im 54/161  bone]
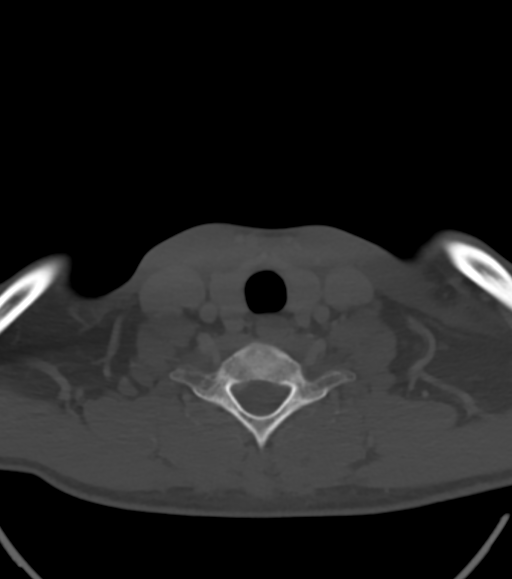
[im 107/161  bone]
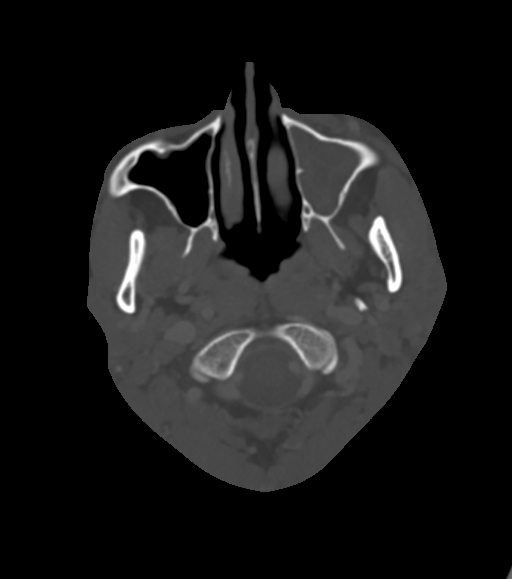

[10 of 33 positions shown; findings below may reference images not displayed]

Multidetector CT imaging of the neck was performed using the
standard protocol following the bolus administration of intravenous
contrast.

RADIATION DOSE REDUCTION: This exam was performed according to the
departmental dose-optimization program which includes automated
exposure control, adjustment of the mA and/or kV according to
patient size and/or use of iterative reconstruction technique.

CONTRAST:  75mL OMNIPAQUE IOHEXOL 300 MG/ML  SOLN
FINDINGS: CT TEMPORAL BONE FINDINGS:

RIGHT TEMPORAL BONE

External auditory canal: Patent.

Middle ear cavity: Normally aerated. The scutum and ossicles are
normal. The tegmen tympani is intact.

Inner ear structures: The cochlea, vestibule and semicircular canals
are normal. The vestibular aqueduct is not enlarged.

Internal auditory and facial nerve canals:  Unremarkable.

Mastoid air cells: Normally aerated.

LEFT TEMPORAL BONE

External auditory canal: Thickened appearance of the tympanic
membrane. This could reflect tympanic membrane thickening or a small
amount of debris within the medial external auditory canal, abutting
the tympanic membrane (for instance as seen on series 3, image 110).

Middle ear cavity: Normally aerated. The scutum and ossicles are
normal. The tegmen tympani is intact.

Inner ear structures: The cochlea, vestibule and semicircular canals
are normal. The vestibular aqueduct is not enlarged.

Internal auditory and facial nerve canals:  Unremarkable.

Mastoid air cells: Normally aerated.

Vascular: Unremarkable appearance of the carotid canals, jugular
bulbs and sigmoid plates.

Limited intracranial: No evidence of acute intracranial abnormality
within the field of view.

Visible orbits/paranasal sinuses: Reported below under the CT neck
findings section.

Soft tissues: Reported below under the CT neck findings section.

CT NECK FINDINGS:

Pharynx and larynx: No appreciable swelling or discrete mass within
the oral cavity, pharynx or larynx. No retropharyngeal collection.

Salivary glands: Mild edema is questioned within the left parotid
gland. Unremarkable appearance of the right parotid and bilateral
submandibular glands.

Thyroid: Unremarkable.

Lymph nodes: Cervical lymphadenopathy, predominantly on the left. An
index left level 2 lymph node measures 17 mm in short axis (series
4, image 70).

Vascular: The major vascular structures of the neck are patent.

Limited intracranial: No evidence of acute intracranial abnormality
within the field of view.

Visualized orbits: No orbital mass or acute orbital finding.

Mastoids and visualized paranasal sinuses: Mild mucosal thickening,
and small-volume fluid, scattered within the bilateral ethmoid
sinuses. Small mucous retention cyst, and mild mucosal thickening,
within the right maxillary sinus. Extensive partial opacification of
the left maxillary sinus secondary to the presence of mucosal
thickening and fluid. No significant mastoid effusion.

Skeleton: Nonspecific reversal of the expected cervical lordosis. No
acute bony abnormality or aggressive osseous lesion.

Upper chest: No consolidation within the imaged lung apices.

Other: There is swelling of the left external ear and left
periauricular soft tissues. Additionally, there is mild-to-moderate
edema/inflammatory stranding within the left face and upper neck.
IMPRESSION: CT temporal bones:

1. Thickened appearance of the left tympanic membrane. This could
reflect tympanic membrane thickening or a small amount of debris
within the medial external auditory canal, abutting the tympanic
membrane
2. Otherwise unremarkable contrast-enhanced CT appearance of the
temporal bones, as detailed.

CT neck:

1. Swelling of the left external ear and left periauricular soft
tissues. Additionally, there is mild-to-moderate edema/inflammatory
stranding within the left face and upper neck. Edema may also extend
to involve the left parotid gland. Findings may reflect left
external otitis with associated left face/neck cellulitis in the
appropriate clinical setting.
2. Paranasal sinus disease. Most notably, there is severe left
maxillary sinusitis.
3. Cervical lymphadenopathy, predominantly on the left. This
lymphadenopathy is likely reactive. However, close clinical
follow-up is recommended (with imaging follow-up as warranted) to
ensure resolution and to exclude alternative etiologies.

## 2022-05-01 ENCOUNTER — Encounter: Payer: Managed Care, Other (non HMO) | Admitting: Emergency Medicine

## 2022-05-29 ENCOUNTER — Ambulatory Visit (INDEPENDENT_AMBULATORY_CARE_PROVIDER_SITE_OTHER): Payer: PRIVATE HEALTH INSURANCE | Admitting: Emergency Medicine

## 2022-05-29 ENCOUNTER — Encounter: Payer: Self-pay | Admitting: Emergency Medicine

## 2022-05-29 VITALS — BP 108/72 | HR 71 | Temp 98.0°F | Ht 65.0 in | Wt 109.4 lb

## 2022-05-29 DIAGNOSIS — Z13220 Encounter for screening for lipoid disorders: Secondary | ICD-10-CM | POA: Diagnosis not present

## 2022-05-29 DIAGNOSIS — Z130 Encounter for screening for diseases of the blood and blood-forming organs and certain disorders involving the immune mechanism: Secondary | ICD-10-CM

## 2022-05-29 DIAGNOSIS — Z1329 Encounter for screening for other suspected endocrine disorder: Secondary | ICD-10-CM

## 2022-05-29 DIAGNOSIS — Z0000 Encounter for general adult medical examination without abnormal findings: Secondary | ICD-10-CM | POA: Diagnosis not present

## 2022-05-29 DIAGNOSIS — Z13228 Encounter for screening for other metabolic disorders: Secondary | ICD-10-CM | POA: Diagnosis not present

## 2022-05-29 LAB — COMPREHENSIVE METABOLIC PANEL
ALT: 18 U/L (ref 0–53)
AST: 17 U/L (ref 0–37)
Albumin: 4.6 g/dL (ref 3.5–5.2)
Alkaline Phosphatase: 46 U/L (ref 39–117)
BUN: 12 mg/dL (ref 6–23)
CO2: 31 mEq/L (ref 19–32)
Calcium: 9.9 mg/dL (ref 8.4–10.5)
Chloride: 103 mEq/L (ref 96–112)
Creatinine, Ser: 1.01 mg/dL (ref 0.40–1.50)
GFR: 97.74 mL/min (ref >60.00)
Glucose, Bld: 81 mg/dL (ref 70–99)
Potassium: 4.2 mEq/L (ref 3.5–5.1)
Sodium: 140 mEq/L (ref 135–145)
Total Bilirubin: 0.8 mg/dL (ref 0.2–1.2)
Total Protein: 7.8 g/dL (ref 6.0–8.3)

## 2022-05-29 LAB — CBC WITH DIFFERENTIAL/PLATELET
Basophils Absolute: 0 10*3/uL (ref 0.0–0.1)
Basophils Relative: 0.5 % (ref 0.0–3.0)
Eosinophils Absolute: 0.1 10*3/uL (ref 0.0–0.7)
Eosinophils Relative: 1.9 % (ref 0.0–5.0)
HCT: 43 % (ref 39.0–52.0)
Hemoglobin: 14.7 g/dL (ref 13.0–17.0)
Lymphocytes Relative: 34.3 % (ref 12.0–46.0)
Lymphs Abs: 2.4 10*3/uL (ref 0.7–4.0)
MCHC: 34 g/dL (ref 30.0–36.0)
MCV: 87.1 fl (ref 78.0–100.0)
Monocytes Absolute: 0.5 10*3/uL (ref 0.1–1.0)
Monocytes Relative: 6.7 % (ref 3.0–12.0)
Neutro Abs: 3.9 10*3/uL (ref 1.4–7.7)
Neutrophils Relative %: 56.6 % (ref 43.0–77.0)
Platelets: 310 10*3/uL (ref 150.0–400.0)
RBC: 4.94 Mil/uL (ref 4.22–5.81)
RDW: 12.6 % (ref 11.5–15.5)
WBC: 6.9 10*3/uL (ref 4.0–10.5)

## 2022-05-29 LAB — LIPID PANEL
Cholesterol: 156 mg/dL (ref 0–200)
HDL: 41.5 mg/dL (ref >39.00)
NonHDL: 114.41
Total CHOL/HDL Ratio: 4
Triglycerides: 201 mg/dL — ABNORMAL HIGH (ref 0.0–149.0)
VLDL: 40.2 mg/dL — ABNORMAL HIGH (ref 0.0–40.0)

## 2022-05-29 LAB — LDL CHOLESTEROL, DIRECT: Direct LDL: 94 mg/dL

## 2022-05-29 LAB — HEMOGLOBIN A1C: Hgb A1c MFr Bld: 5.4 % (ref 4.6–6.5)

## 2022-05-29 NOTE — Patient Instructions (Signed)
Health Maintenance, Male Adopting a healthy lifestyle and getting preventive care are important in promoting health and wellness. Ask your health care provider about: The right schedule for you to have regular tests and exams. Things you can do on your own to prevent diseases and keep yourself healthy. What should I know about diet, weight, and exercise? Eat a healthy diet  Eat a diet that includes plenty of vegetables, fruits, low-fat dairy products, and lean protein. Do not eat a lot of foods that are high in solid fats, added sugars, or sodium. Maintain a healthy weight Body mass index (BMI) is a measurement that can be used to identify possible weight problems. It estimates body fat based on height and weight. Your health care provider can help determine your BMI and help you achieve or maintain a healthy weight. Get regular exercise Get regular exercise. This is one of the most important things you can do for your health. Most adults should: Exercise for at least 150 minutes each week. The exercise should increase your heart rate and make you sweat (moderate-intensity exercise). Do strengthening exercises at least twice a week. This is in addition to the moderate-intensity exercise. Spend less time sitting. Even light physical activity can be beneficial. Watch cholesterol and blood lipids Have your blood tested for lipids and cholesterol at 33 years of age, then have this test every 5 years. You may need to have your cholesterol levels checked more often if: Your lipid or cholesterol levels are high. You are older than 33 years of age. You are at high risk for heart disease. What should I know about cancer screening? Many types of cancers can be detected early and may often be prevented. Depending on your health history and family history, you may need to have cancer screening at various ages. This may include screening for: Colorectal cancer. Prostate cancer. Skin cancer. Lung  cancer. What should I know about heart disease, diabetes, and high blood pressure? Blood pressure and heart disease High blood pressure causes heart disease and increases the risk of stroke. This is more likely to develop in people who have high blood pressure readings or are overweight. Talk with your health care provider about your target blood pressure readings. Have your blood pressure checked: Every 3-5 years if you are 18-39 years of age. Every year if you are 40 years old or older. If you are between the ages of 65 and 75 and are a current or former smoker, ask your health care provider if you should have a one-time screening for abdominal aortic aneurysm (AAA). Diabetes Have regular diabetes screenings. This checks your fasting blood sugar level. Have the screening done: Once every three years after age 45 if you are at a normal weight and have a low risk for diabetes. More often and at a younger age if you are overweight or have a high risk for diabetes. What should I know about preventing infection? Hepatitis B If you have a higher risk for hepatitis B, you should be screened for this virus. Talk with your health care provider to find out if you are at risk for hepatitis B infection. Hepatitis C Blood testing is recommended for: Everyone born from 1945 through 1965. Anyone with known risk factors for hepatitis C. Sexually transmitted infections (STIs) You should be screened each year for STIs, including gonorrhea and chlamydia, if: You are sexually active and are younger than 33 years of age. You are older than 33 years of age and your   health care provider tells you that you are at risk for this type of infection. Your sexual activity has changed since you were last screened, and you are at increased risk for chlamydia or gonorrhea. Ask your health care provider if you are at risk. Ask your health care provider about whether you are at high risk for HIV. Your health care provider  may recommend a prescription medicine to help prevent HIV infection. If you choose to take medicine to prevent HIV, you should first get tested for HIV. You should then be tested every 3 months for as long as you are taking the medicine. Follow these instructions at home: Alcohol use Do not drink alcohol if your health care provider tells you not to drink. If you drink alcohol: Limit how much you have to 0-2 drinks a day. Know how much alcohol is in your drink. In the [place] one drink equals one 12 oz bottle of beer (355 mL), one 5 oz glass of wine (148 mL), or one 1 oz glass of hard liquor (44 mL). Lifestyle Do not use any products that contain nicotine or tobacco. These products include cigarettes, chewing tobacco, and vaping devices, such as e-cigarettes. If you need help quitting, ask your health care provider. Do not use street drugs. Do not share needles. Ask your health care provider for help if you need support or information about quitting drugs. General instructions Schedule regular health, dental, and eye exams. Stay current with your vaccines. Tell your health care provider if: You often feel depressed. You have ever been abused or do not feel safe at home. Summary Adopting a healthy lifestyle and getting preventive care are important in promoting health and wellness. Follow your health care provider's instructions about healthy diet, exercising, and getting tested or screened for diseases. Follow your health care provider's instructions on monitoring your cholesterol and blood pressure. This information is not intended to replace advice given to you by your health care provider. Make sure you discuss any questions you have with your health care provider. Document Revised: 12/19/2020 Document Reviewed: 12/19/2020 Elsevier Patient Education  2023 [employer]  

## 2022-05-29 NOTE — Progress Notes (Signed)
Randy Roberts 31 y.o.   Chief Complaint  Patient presents with   Annual Exam    No concerns     HISTORY OF PRESENT ILLNESS: This is a 31 y.o. male here for physical. Has no complaints or medical concerns today. No chronic medical problems.  No chronic medications. Non-smoker.  Healthy lifestyle.  HPI   Prior to Admission medications   Medication Sig Start Date End Date Taking? Authorizing Provider  acetaminophen (TYLENOL) 500 MG tablet Take 1 tablet (500 mg total) by mouth every 6 (six) hours as needed. 11/07/21  Yes Fayrene Helper, PA-C  MILK THISTLE PO Take by mouth.   Yes [provider]  Multiple Vitamins-Minerals (MULTIVITAMIN WITH MINERALS) tablet Take 1 tablet by mouth daily.   Yes [provider]    Allergies  Allergen Reactions   Penicillins Itching    Has patient had a PCN reaction causing immediate rash, facial/tongue/throat swelling, SOB or lightheadedness with hypotension: Yes Has patient had a PCN reaction causing severe rash involving mucus membranes or skin necrosis: No Has patient had a PCN reaction that required hospitalization: No Has patient had a PCN reaction occurring within the last 10 years: No If all of the above answers are "NO", then may proceed with Cephalosporin use.    Tetracyclines & Related Itching   Shellfish Allergy     There are no problems to display for this patient.   Past Medical History:  Diagnosis Date   Allergy     History reviewed. No pertinent surgical history.  Social History   Socioeconomic History   Marital status: Married    Spouse name: Not on file   Number of children: Not on file   Years of education: Not on file   Highest education level: Not on file  Occupational History   Not on file  Tobacco Use   Smoking status: Never   Smokeless tobacco: Never  Substance and Sexual Activity   Alcohol use: No   Drug use: No   Sexual activity: Never  Other Topics Concern   Not on file  Social History  Narrative   Not on file   Social Determinants of Health   Financial Resource Strain: Not on file  Food Insecurity: Not on file  Transportation Needs: Not on file  Physical Activity: Not on file  Stress: Not on file  Social Connections: Not on file  Intimate Partner Violence: Not on file    History reviewed. No pertinent family history.   Review of Systems  Constitutional: Negative.  Negative for chills and fever.  HENT: Negative.    Respiratory: Negative.    Cardiovascular: Negative.   Gastrointestinal: Negative.  Negative for abdominal pain, nausea and vomiting.  Genitourinary: Negative.  Negative for dysuria.  Skin: Negative.   Neurological: Negative.  Negative for dizziness and headaches.  All other systems reviewed and are negative.  Today's Vitals   05/29/22 0805  BP: 108/72  Pulse: 71  Temp: 98 F (36.7 C)  TempSrc: Oral  SpO2: 96%  Weight: 109 lb 6 oz (49.6 kg)  Height: 5\' 5"  (1.651 m)   Body mass index is 18.2 kg/m.   Physical Exam Vitals reviewed.  Constitutional:      Appearance: Normal appearance.  HENT:     Head: Normocephalic.     Right Ear: Tympanic membrane, ear canal and external ear normal.     Left Ear: Tympanic membrane, ear canal and external ear normal.     Mouth/Throat:  Mouth: Mucous membranes are moist.     Pharynx: Oropharynx is clear.  Eyes:     Extraocular Movements: Extraocular movements intact.     Conjunctiva/sclera: Conjunctivae normal.     Pupils: Pupils are equal, round, and reactive to light.  Cardiovascular:     Rate and Rhythm: Normal rate and regular rhythm.     Pulses: Normal pulses.     Heart sounds: Normal heart sounds.  Pulmonary:     Effort: Pulmonary effort is normal.     Breath sounds: Normal breath sounds.  Abdominal:     General: There is no distension.     Palpations: Abdomen is soft.     Tenderness: There is no abdominal tenderness.  Musculoskeletal:        General: Normal range of motion.      Cervical back: No tenderness.  Lymphadenopathy:     Cervical: No cervical adenopathy.  Skin:    General: Skin is warm and dry.  Neurological:     General: No focal deficit present.     Mental Status: He is alert and oriented to person, place, and time.  Psychiatric:        Mood and Affect: Mood normal.        Behavior: Behavior normal.      ASSESSMENT & PLAN: Problem List Items Addressed This Visit   None Visit Diagnoses     Routine general medical examination at a health care facility    -  Primary   Screening for deficiency anemia       Relevant Orders   CBC with Differential   Screening for lipoid disorders       Relevant Orders   Lipid panel   Screening for endocrine, metabolic and immunity disorder       Relevant Orders   Comprehensive metabolic panel   Hemoglobin A1c      Modifiable risk factors discussed with patient. Anticipatory guidance according to age provided. The following topics were also discussed: Social Determinants of Health Smoking.  Non-smoker Diet and nutrition Benefits of exercise Cancer family history review Vaccinations reviewed and recommendations Cardiovascular risk assessment and need for blood work Mental health including depression and anxiety Fall and accident prevention  Patient Instructions  Health Maintenance, Male Adopting a healthy lifestyle and getting preventive care are important in promoting health and wellness. Ask your health care provider about: The right schedule for you to have regular tests and exams. Things you can do on your own to prevent diseases and keep yourself healthy. What should I know about diet, weight, and exercise? Eat a healthy diet  Eat a diet that includes plenty of vegetables, fruits, low-fat dairy products, and lean protein. Do not eat a lot of foods that are high in solid fats, added sugars, or sodium. Maintain a healthy weight Body mass index (BMI) is a measurement that can be used to identify  possible weight problems. It estimates body fat based on height and weight. Your health care provider can help determine your BMI and help you achieve or maintain a healthy weight. Get regular exercise Get regular exercise. This is one of the most important things you can do for your health. Most adults should: Exercise for at least 150 minutes each week. The exercise should increase your heart rate and make you sweat (moderate-intensity exercise). Do strengthening exercises at least twice a week. This is in addition to the moderate-intensity exercise. Spend less time sitting. Even light physical activity can be beneficial. Watch  cholesterol and blood lipids Have your blood tested for lipids and cholesterol at 33 years of age, then have this test every 5 years. You may need to have your cholesterol levels checked more often if: Your lipid or cholesterol levels are high. You are older than 33 years of age. You are at high risk for heart disease. What should I know about cancer screening? Many types of cancers can be detected early and may often be prevented. Depending on your health history and family history, you may need to have cancer screening at various ages. This may include screening for: Colorectal cancer. Prostate cancer. Skin cancer. Lung cancer. What should I know about heart disease, diabetes, and high blood pressure? Blood pressure and heart disease High blood pressure causes heart disease and increases the risk of stroke. This is more likely to develop in people who have high blood pressure readings or are overweight. Talk with your health care provider about your target blood pressure readings. Have your blood pressure checked: Every 3-5 years if you are 40-52 years of age. Every year if you are 44 years old or older. If you are between the ages of 18 and 25 and are a current or former smoker, ask your health care provider if you should have a one-time screening for abdominal  aortic aneurysm (AAA). Diabetes Have regular diabetes screenings. This checks your fasting blood sugar level. Have the screening done: Once every three years after age 46 if you are at a normal weight and have a low risk for diabetes. More often and at a younger age if you are overweight or have a high risk for diabetes. What should I know about preventing infection? Hepatitis B If you have a higher risk for hepatitis B, you should be screened for this virus. Talk with your health care provider to find out if you are at risk for hepatitis B infection. Hepatitis C Blood testing is recommended for: Everyone born from [birth year] through 1965. Anyone with known risk factors for hepatitis C. Sexually transmitted infections (STIs) You should be screened each year for STIs, including gonorrhea and chlamydia, if: You are sexually active and are younger than 33 years of age. You are older than 33 years of age and your health care provider tells you that you are at risk for this type of infection. Your sexual activity has changed since you were last screened, and you are at increased risk for chlamydia or gonorrhea. Ask your health care provider if you are at risk. Ask your health care provider about whether you are at high risk for HIV. Your health care provider may recommend a prescription medicine to help prevent HIV infection. If you choose to take medicine to prevent HIV, you should first get tested for HIV. You should then be tested every 3 months for as long as you are taking the medicine. Follow these instructions at home: Alcohol use Do not drink alcohol if your health care provider tells you not to drink. If you drink alcohol: Limit how much you have to 0-2 drinks a day. Know how much alcohol is in your drink. In the [place] one drink equals one 12 oz bottle of beer (355 mL), one 5 oz glass of wine (148 mL), or one 1 oz glass of hard liquor (44 mL). Lifestyle Do not use any products that contain  nicotine or tobacco. These products include cigarettes, chewing tobacco, and vaping devices, such as e-cigarettes. If you need help quitting, ask your health care  provider. Do not use street drugs. Do not share needles. Ask your health care provider for help if you need support or information about quitting drugs. General instructions Schedule regular health, dental, and eye exams. Stay current with your vaccines. Tell your health care provider if: You often feel depressed. You have ever been abused or do not feel safe at home. Summary Adopting a healthy lifestyle and getting preventive care are important in promoting health and wellness. Follow your health care provider's instructions about healthy diet, exercising, and getting tested or screened for diseases. Follow your health care provider's instructions on monitoring your cholesterol and blood pressure. This information is not intended to replace advice given to you by your health care provider. Make sure you discuss any questions you have with your health care provider. Document Revised: 12/19/2020 Document Reviewed: 12/19/2020 Elsevier Patient Education  2023 [employer] Inc.     Edwina Barth, MD [facility]

## 2022-09-18 ENCOUNTER — Encounter: Payer: Self-pay | Admitting: Emergency Medicine

## 2022-09-25 ENCOUNTER — Encounter: Payer: Self-pay | Admitting: Emergency Medicine

## 2022-09-25 ENCOUNTER — Ambulatory Visit (INDEPENDENT_AMBULATORY_CARE_PROVIDER_SITE_OTHER): Payer: PRIVATE HEALTH INSURANCE | Admitting: Emergency Medicine

## 2022-09-25 VITALS — BP 108/64 | HR 78 | Temp 98.3°F | Ht 65.0 in | Wt 109.4 lb

## 2022-09-25 DIAGNOSIS — Z3009 Encounter for other general counseling and advice on contraception: Secondary | ICD-10-CM | POA: Diagnosis not present

## 2022-09-25 NOTE — Progress Notes (Signed)
Randy Roberts 32 y.o.   Chief Complaint  Patient presents with   Follow-up    Patient wants a referral to urology for poss vasectomy     HISTORY OF PRESENT ILLNESS: This is a 32 y.o. male needs referral for vasectomy.  HPI   Prior to Admission medications   Medication Sig Start Date End Date Taking? Authorizing Provider  acetaminophen (TYLENOL) 500 MG tablet Take 1 tablet (500 mg total) by mouth every 6 (six) hours as needed. 11/07/21  Yes Domenic Moras, PA-C  MILK THISTLE PO Take by mouth.   Yes [provider]  Multiple Vitamins-Minerals (MULTIVITAMIN WITH MINERALS) tablet Take 1 tablet by mouth daily.   Yes [provider]    Allergies  Allergen Reactions   Penicillins Itching    Has patient had a PCN reaction causing immediate rash, facial/tongue/throat swelling, SOB or lightheadedness with hypotension: Yes Has patient had a PCN reaction causing severe rash involving mucus membranes or skin necrosis: No Has patient had a PCN reaction that required hospitalization: No Has patient had a PCN reaction occurring within the last 10 years: No If all of the above answers are "NO", then may proceed with Cephalosporin use.    Tetracyclines & Related Itching   Shellfish Allergy     There are no problems to display for this patient.   Past Medical History:  Diagnosis Date   Allergy     No past surgical history on file.  Social History   Socioeconomic History   Marital status: Married    Spouse name: Not on file   Number of children: Not on file   Years of education: Not on file   Highest education level: Not on file  Occupational History   Not on file  Tobacco Use   Smoking status: Never   Smokeless tobacco: Never  Substance and Sexual Activity   Alcohol use: No   Drug use: No   Sexual activity: Never  Other Topics Concern   Not on file  Social History Narrative   Not on file   Social Determinants of Health   Financial Resource Strain: Not on  file  Food Insecurity: Not on file  Transportation Needs: Not on file  Physical Activity: Not on file  Stress: Not on file  Social Connections: Not on file  Intimate Partner Violence: Not on file    No family history on file.   Review of Systems  Constitutional: Negative.  Negative for fever.  HENT: Negative.  Negative for congestion and sore throat.   Respiratory: Negative.  Negative for cough and shortness of breath.   Cardiovascular: Negative.  Negative for chest pain and palpitations.  Gastrointestinal:  Negative for abdominal pain, diarrhea, nausea and vomiting.  Skin: Negative.  Negative for rash.  Neurological: Negative.  Negative for dizziness and headaches.  All other systems reviewed and are negative.  Today's Vitals   09/25/22 1433  BP: 108/64  Pulse: 78  Temp: 98.3 F (36.8 C)  TempSrc: Oral  SpO2: 96%  Weight: 109 lb 6 oz (49.6 kg)  Height: 5' 5"$  (1.651 m)   Body mass index is 18.2 kg/m.   Physical Exam Vitals reviewed.  Constitutional:      Appearance: Normal appearance.  HENT:     Head: Normocephalic.  Eyes:     Extraocular Movements: Extraocular movements intact.  Cardiovascular:     Rate and Rhythm: Normal rate.  Pulmonary:     Effort: Pulmonary effort is normal.  Skin:  General: Skin is warm and dry.  Neurological:     Mental Status: He is alert and oriented to person, place, and time.  Psychiatric:        Behavior: Behavior normal.      ASSESSMENT & PLAN: Problem List Items Addressed This Visit   None Visit Diagnoses     Vasectomy evaluation    -  Primary   Relevant Orders   Ambulatory referral to Urology      Patient Instructions  Vasectomy Vasectomy is a procedure in which the vas deferens is cut and then tied or burned (cauterized). The vas deferens is a tube that carries sperm from the testicle to the part of the body that drains urine from the bladder (urethra). This procedure blocks sperm from going through the vas  deferens and penis during ejaculation. This ensures that sperm does not go into the vagina during sex. Vasectomy does not affect sexual desire or performance and does not prevent sexually transmitted infections. Vasectomy is considered a permanent and very effective form of birth control (contraception). The decision to have a vasectomy should not be made during a stressful time, such as after the loss of a pregnancy or a divorce. You and your partner should decide on whether to have a vasectomy when you are sure that you do not want children in the future. Tell a health care provider about: Any allergies you have. All medicines you are taking, including vitamins, herbs, eye drops, creams, and over-the-counter medicines. Any problems you or family members have had with anesthetic medicines. Any blood disorders you have. Any surgeries you have had. Any medical conditions you have. What are the risks? Generally, this is a safe procedure. However, problems may occur, including: Infection. Bleeding and swelling of the scrotum. The scrotum is the sac that contains the testicles, blood vessels, and structures that help deliver sperm and semen. Allergic reactions to medicines. Failure of the procedure to prevent pregnancy. There is a very small chance that the tied or cauterized ends of the vas deferens may reconnect (recanalization). If this happens, you could still make a woman pregnant. Pain in the scrotum that continues after you heal from the procedure. What happens before the procedure? Medicines Ask your health care provider about: Changing or stopping your regular medicines. This is especially important if you are taking diabetes medicines or blood thinners. Taking medicines such as aspirin and ibuprofen. These medicines can thin your blood. Do not take these medicines unless your health care provider tells you to take them. Taking over-the-counter medicines, vitamins, herbs, and  supplements. You may be told to take a medicine to help you relax (sedative) a few hours before the procedure. General instructions Do not use any products that contain nicotine or tobacco for at least 4 weeks before the procedure. These products include cigarettes, e-cigarettes, and chewing tobacco. If you need help quitting, ask your health care provider. Plan to have a responsible adult take you home from the hospital or clinic. If you will be going home right after the procedure, plan to have a responsible adult care for you for the time you are told. This is important. Ask your health care provider: How your surgery site will be marked. What steps will be taken to help prevent infection. These steps may include: Removing hair at the surgery site. Washing skin with a germ-killing soap. Taking antibiotic medicine. What happens during the procedure?  You will be given one or more of the following: A sedative,  unless you were told to take this a few hours before the procedure. A medicine to numb the area (local anesthetic). Your health care provider will feel, or palpate, for your vas deferens. To reach the vas deferens, one of two methods may be used: A very small incision may be made in your scrotum. A punctured opening may be made in your scrotum, without an incision. Your vas deferens will be pulled out of your scrotum and cut. Then, the vas deferens will be closed in one of two ways: Tied at the ends. Cauterized at the ends to seal them off. The vas deferens will be put back into your scrotum. The incision or puncture opening will be closed with absorbable stitches (sutures). The sutures will eventually dissolve and will not need to be removed after the procedure. The procedure will be repeated on the other side of your scrotum. The procedure may vary among health care providers and hospitals. What happens after the procedure? You will be monitored to make sure that you do not have  problems. You will be asked not to ejaculate for at least 1 week after the procedure, or for as long as you are told. You will need to use a different form of contraception for 2-4 months after the procedure, until you have test results confirming that there are no sperm in your semen. You may be given scrotal support to wear, such as a jockstrap or underwear with a supportive pouch. If you were given a sedative during the procedure, it can affect you for several hours. Do not drive or operate machinery until your health care provider says that it is safe. Summary Vasectomy blocks sperm from being released during ejaculation. This procedure is considered a permanent and very effective form of birth control. Your scrotum will be numbed with medicine (local anesthetic) for the procedure. After the procedure, you will be asked not to ejaculate for at least 1 week, or for as long as you are told. You will also need to use a different form of contraception until your test results confirm that there are no sperm in your semen. This information is not intended to replace advice given to you by your health care provider. Make sure you discuss any questions you have with your health care provider. Document Revised: 12/17/2019 Document Reviewed: 12/17/2019 Elsevier Patient Education  [place], MD [facility]

## 2022-09-25 NOTE — Patient Instructions (Signed)
Vasectomy Vasectomy is a procedure in which the vas deferens is cut and then tied or burned (cauterized). The vas deferens is a tube that carries sperm from the testicle to the part of the body that drains urine from the bladder (urethra). This procedure blocks sperm from going through the vas deferens and penis during ejaculation. This ensures that sperm does not go into the vagina during sex. Vasectomy does not affect sexual desire or performance and does not prevent sexually transmitted infections. Vasectomy is considered a permanent and very effective form of birth control (contraception). The decision to have a vasectomy should not be made during a stressful time, such as after the loss of a pregnancy or a divorce. You and your partner should decide on whether to have a vasectomy when you are sure that you do not want children in the future. Tell a health care provider about: Any allergies you have. All medicines you are taking, including vitamins, herbs, eye drops, creams, and over-the-counter medicines. Any problems you or family members have had with anesthetic medicines. Any blood disorders you have. Any surgeries you have had. Any medical conditions you have. What are the risks? Generally, this is a safe procedure. However, problems may occur, including: Infection. Bleeding and swelling of the scrotum. The scrotum is the sac that contains the testicles, blood vessels, and structures that help deliver sperm and semen. Allergic reactions to medicines. Failure of the procedure to prevent pregnancy. There is a very small chance that the tied or cauterized ends of the vas deferens may reconnect (recanalization). If this happens, you could still make a woman pregnant. Pain in the scrotum that continues after you heal from the procedure. What happens before the procedure? Medicines Ask your health care provider about: Changing or stopping your regular medicines. This is especially important if  you are taking diabetes medicines or blood thinners. Taking medicines such as aspirin and ibuprofen. These medicines can thin your blood. Do not take these medicines unless your health care provider tells you to take them. Taking over-the-counter medicines, vitamins, herbs, and supplements. You may be told to take a medicine to help you relax (sedative) a few hours before the procedure. General instructions Do not use any products that contain nicotine or tobacco for at least 4 weeks before the procedure. These products include cigarettes, e-cigarettes, and chewing tobacco. If you need help quitting, ask your health care provider. Plan to have a responsible adult take you home from the hospital or clinic. If you will be going home right after the procedure, plan to have a responsible adult care for you for the time you are told. This is important. Ask your health care provider: How your surgery site will be marked. What steps will be taken to help prevent infection. These steps may include: Removing hair at the surgery site. Washing skin with a germ-killing soap. Taking antibiotic medicine. What happens during the procedure?  You will be given one or more of the following: A sedative, unless you were told to take this a few hours before the procedure. A medicine to numb the area (local anesthetic). Your health care provider will feel, or palpate, for your vas deferens. To reach the vas deferens, one of two methods may be used: A very small incision may be made in your scrotum. A punctured opening may be made in your scrotum, without an incision. Your vas deferens will be pulled out of your scrotum and cut. Then, the vas deferens will be closed  in one of two ways: Tied at the ends. Cauterized at the ends to seal them off. The vas deferens will be put back into your scrotum. The incision or puncture opening will be closed with absorbable stitches (sutures). The sutures will eventually  dissolve and will not need to be removed after the procedure. The procedure will be repeated on the other side of your scrotum. The procedure may vary among health care providers and hospitals. What happens after the procedure? You will be monitored to make sure that you do not have problems. You will be asked not to ejaculate for at least 1 week after the procedure, or for as long as you are told. You will need to use a different form of contraception for 2-4 months after the procedure, until you have test results confirming that there are no sperm in your semen. You may be given scrotal support to wear, such as a jockstrap or underwear with a supportive pouch. If you were given a sedative during the procedure, it can affect you for several hours. Do not drive or operate machinery until your health care provider says that it is safe. Summary Vasectomy blocks sperm from being released during ejaculation. This procedure is considered a permanent and very effective form of birth control. Your scrotum will be numbed with medicine (local anesthetic) for the procedure. After the procedure, you will be asked not to ejaculate for at least 1 week, or for as long as you are told. You will also need to use a different form of contraception until your test results confirm that there are no sperm in your semen. This information is not intended to replace advice given to you by your health care provider. Make sure you discuss any questions you have with your health care provider. Document Revised: 12/17/2019 Document Reviewed: 12/17/2019 Elsevier Patient Education  [place].

## 2022-10-30 ENCOUNTER — Ambulatory Visit (INDEPENDENT_AMBULATORY_CARE_PROVIDER_SITE_OTHER): Payer: PRIVATE HEALTH INSURANCE | Admitting: Urology

## 2022-10-30 ENCOUNTER — Encounter: Payer: Self-pay | Admitting: Urology

## 2022-10-30 VITALS — BP 115/77 | HR 83 | Ht 64.0 in | Wt 110.0 lb

## 2022-10-30 DIAGNOSIS — Z3009 Encounter for other general counseling and advice on contraception: Secondary | ICD-10-CM

## 2022-10-30 MED ORDER — ALPRAZOLAM 1 MG PO TABS: ORAL_TABLET | ORAL | 0 refills | Status: DC

## 2022-10-30 NOTE — Progress Notes (Signed)
Assessment: 1. Encounter for vasectomy assessment     Plan: Schedule for vasectomy per patient request. Will need to schedule after 30 days due to his Medicaid coverage. Rx for alprazolam 1 mg pre-procedure provided.   Chief Complaint:  Chief Complaint  Patient presents with   VAS Consult    History of Present Illness:  Randy Roberts is a 32 y.o. male who is seen for vasectomy evaluation.   He is married with 4 children.  No history of scrotal trauma or infection.  Past Medical History:  Past Medical History:  Diagnosis Date   Allergy     Past Surgical History:  No past surgical history on file.  Allergies:  Allergies  Allergen Reactions   Penicillins Itching    Has patient had a PCN reaction causing immediate rash, facial/tongue/throat swelling, SOB or lightheadedness with hypotension: Yes Has patient had a PCN reaction causing severe rash involving mucus membranes or skin necrosis: No Has patient had a PCN reaction that required hospitalization: No Has patient had a PCN reaction occurring within the last 10 years: No If all of the above answers are "NO", then may proceed with Cephalosporin use.    Tetracyclines & Related Itching   Shellfish Allergy     Family History:  No family history on file.  Social History:  Social History   Tobacco Use   Smoking status: Never   Smokeless tobacco: Never  Substance Use Topics   Alcohol use: No   Drug use: No    Review of symptoms:  Constitutional:  Negative for unexplained weight loss, night sweats, fever, chills ENT:  Negative for nose bleeds, sinus pain, painful swallowing CV:  Negative for chest pain, shortness of breath, exercise intolerance, palpitations, loss of consciousness Resp:  Negative for cough, wheezing, shortness of breath GI:  Negative for nausea, vomiting, diarrhea, bloody stools GU:  Positives noted in HPI; otherwise negative for gross hematuria, dysuria, urinary incontinence Neuro:  Negative  for seizures, poor balance, limb weakness, slurred speech Psych:  Negative for lack of energy, depression, anxiety Endocrine:  Negative for polydipsia, polyuria, symptoms of hypoglycemia (dizziness, hunger, sweating) Hematologic:  Negative for anemia, purpura, petechia, prolonged or excessive bleeding, use of anticoagulants  Allergic:  Negative for difficulty breathing or choking as a result of exposure to anything; no shellfish allergy; no allergic response (rash/itch) to materials, foods  Physical exam: BP 115/77   Pulse 83   Ht 5\' 4"  (1.626 m)   Wt 110 lb (49.9 kg)   BMI 18.88 kg/m  GENERAL APPEARANCE:  Well appearing, well developed, well nourished, NAD HEENT:  Atraumatic, normocephalic, oropharynx clear NECK:  Supple without lymphadenopathy or thyromegaly ABDOMEN:  Soft, non-tender, no masses EXTREMITIES:  Moves all extremities well, without clubbing, cyanosis, or edema NEUROLOGIC:  Alert and oriented x 3, normal gait, CN II-XII grossly intact MENTAL STATUS:  appropriate BACK:  Non-tender to palpation, No CVAT SKIN:  Warm, dry, and intact GU: Penis:  uncircumcised Meatus: Normal Scrotum: vas palpated bilaterally Testis: normal without masses bilateral Epididymis: normal  Results: None  VASECTOMY CONSULTATION  Randy Roberts presents for vasectomy consultation today.  He is a 32 y.o. male, Married with 4  children .  He and his wife have discussed the issues regarding long-term fertility and are comfortable with this decision.  He presents for consideration for vasectomy.  I discussed the issues in detail with him today and he expressed no reservations.  As to the procedure, no scalpel technique vasectomy is explained  and reviewed in detail.  Generalized risks including but not limited to bleeding, infection, orchalgia, testicular atrophy, epididymitis, scrotal hematoma, and chronic pain are discussed.   Additionally, he understands that the possibility of vas recanalization  following vasectomy is possible although rare.  Most importantly, the patient understands that he is not sterile initially and will need a semen analysis check to confirm sterility such that no sperm are seen.  He is advised to avoid ejaculation for 10 days following the procedure.  The initial semen analysis will be checked in approximately 12 weeks and in some patients, several months may be required for clearance of all sperm.  He reports a clear understanding of the need for continued birth control until sterility is confirmed.  Otherwise, general issues regarding local anesthesia, prep, alprazolam are discussed and he reports a clear understanding.

## 2022-10-30 NOTE — Patient Instructions (Signed)

## 2022-12-11 ENCOUNTER — Encounter: Payer: PRIVATE HEALTH INSURANCE | Admitting: Urology

## 2022-12-12 ENCOUNTER — Ambulatory Visit (INDEPENDENT_AMBULATORY_CARE_PROVIDER_SITE_OTHER): Payer: PRIVATE HEALTH INSURANCE | Admitting: Urology

## 2022-12-12 ENCOUNTER — Encounter: Payer: Self-pay | Admitting: Urology

## 2022-12-12 VITALS — BP 94/63 | HR 71

## 2022-12-12 DIAGNOSIS — Z302 Encounter for sterilization: Secondary | ICD-10-CM | POA: Diagnosis not present

## 2022-12-12 MED ORDER — HYDROCODONE-ACETAMINOPHEN 5-325 MG PO TABS: 1.0000 | ORAL_TABLET | Freq: Four times a day (QID) | ORAL | 0 refills | Status: DC | PRN

## 2022-12-12 MED ORDER — LEVOFLOXACIN 500 MG PO TABS: 500.0000 mg | ORAL_TABLET | Freq: Every day | ORAL | 0 refills | Status: AC

## 2022-12-12 NOTE — Addendum Note (Signed)
Addended by: Tilden Dome on: 12/12/2022 01:16 PM   Modules accepted: Orders

## 2022-12-12 NOTE — Progress Notes (Signed)
   Assessment: 1. Encounter for vasectomy     Plan: Post vasectomy instructions given Rx provided Post vasectomy semen analysis in 12 weeks  Chief Complaint:  Chief Complaint  Patient presents with   VAS    History of Present Illness:  Randy Roberts is a 33 y.o. male who is seen for vasectomy.  He is married with 4 children.  No history of scrotal trauma or infection.  Past Medical History:  Past Medical History:  Diagnosis Date   Allergy     Past Surgical History:  No past surgical history on file.  Allergies:  Allergies  Allergen Reactions   Penicillins Itching    Has patient had a PCN reaction causing immediate rash, facial/tongue/throat swelling, SOB or lightheadedness with hypotension: Yes Has patient had a PCN reaction causing severe rash involving mucus membranes or skin necrosis: No Has patient had a PCN reaction that required hospitalization: No Has patient had a PCN reaction occurring within the last 10 years: No If all of the above answers are "NO", then may proceed with Cephalosporin use.    Tetracyclines & Related Itching   Shellfish Allergy     Family History:  No family history on file.  Social History:  Social History   Tobacco Use   Smoking status: Never   Smokeless tobacco: Never  Substance Use Topics   Alcohol use: No   Drug use: No    ROS: Constitutional:  Negative for fever, chills, weight loss CV: Negative for chest pain, previous MI, hypertension Respiratory:  Negative for shortness of breath, wheezing, sleep apnea, frequent cough GI:  Negative for nausea, vomiting, bloody stool, GERD  Physical exam: BP 94/63   Pulse 71  GENERAL APPEARANCE:  Well appearing, well developed, well nourished, NAD HEENT:  Atraumatic, normocephalic, oropharynx clear NECK:  Supple without lymphadenopathy or thyromegaly ABDOMEN:  Soft, non-tender, no masses EXTREMITIES:  Moves all extremities well, without clubbing, cyanosis, or edema NEUROLOGIC:   Alert and oriented x 3, normal gait, CN II-XII grossly intact MENTAL STATUS:  appropriate BACK:  Non-tender to palpation, No CVAT SKIN:  Warm, dry, and intact  Results: None  VASECTOMY PROCEDURE:  Taiwo Fish presents for vasectomy following previous vasectomy consultation and permit is signed.  The patient's anterior scrotal wall is shaved and prepped with Betadine in standard sterile fashion.  1% lidocaine is used as local anesthetic in the scrotal and peri vasal tissue.  A standard median raphe punch incision is made and a no scalpel technique vasectomy is performed.  Bilateral vas are isolated from the peri vasal tissue and an approximately 1 cm segment of vas is excised.  Proximal and distal segments are internally cauterized with electric heat cautery. Interposition of perivasal tissue was performed.  Bilateral palpation confirms bilateral vasectomy defect and no significant bleeding or hematoma is identified.  Neosporin gauze dressing and a scrotal support are applied.    Disposition: Patient is discharged home with Rx for pain medication and antibiotics.  Patient is given routine vasectomy instructions.   Most importantly, he is instructed and cautioned again regarding the need for protected intercourse until such time that a single  negative semen analysis has been obtained.  The initial semen analysis will be checked in approximately 12  weeks.  The patient reports a clear understanding.  He will call with any interval questions or concerns.

## 2022-12-12 NOTE — Patient Instructions (Signed)

## 2022-12-12 NOTE — Progress Notes (Signed)
Error

## 2023-01-22 ENCOUNTER — Encounter: Payer: Self-pay | Admitting: Emergency Medicine

## 2023-01-22 ENCOUNTER — Ambulatory Visit (INDEPENDENT_AMBULATORY_CARE_PROVIDER_SITE_OTHER): Payer: PRIVATE HEALTH INSURANCE | Admitting: Emergency Medicine

## 2023-01-22 ENCOUNTER — Ambulatory Visit (INDEPENDENT_AMBULATORY_CARE_PROVIDER_SITE_OTHER): Payer: PRIVATE HEALTH INSURANCE

## 2023-01-22 VITALS — BP 110/70 | HR 67 | Temp 97.7°F | Ht 64.0 in | Wt 110.2 lb

## 2023-01-22 DIAGNOSIS — R053 Chronic cough: Secondary | ICD-10-CM

## 2023-01-22 DIAGNOSIS — J22 Unspecified acute lower respiratory infection: Secondary | ICD-10-CM

## 2023-01-22 MED ORDER — BENZONATATE 200 MG PO CAPS
200.0000 mg | ORAL_CAPSULE | Freq: Two times a day (BID) | ORAL | 0 refills | Status: DC | PRN
Start: 2023-01-22 — End: 2023-11-05

## 2023-01-22 MED ORDER — AZITHROMYCIN 250 MG PO TABS: ORAL_TABLET | ORAL | 0 refills | Status: DC

## 2023-01-22 NOTE — Assessment & Plan Note (Signed)
Starting to develop secondary bacterial infection No signs of pneumonia.  Clinically stable. No red flag signs or symptoms Will benefit from daily azithromycin for 5 days ED precautions given Advised to rest and stay well-hydrated Advised to contact the office if no better or worse during the next several days

## 2023-01-22 NOTE — Progress Notes (Signed)
Randy Roberts 32 y.o.   Chief Complaint  Patient presents with   Cough    Patient has been having a productive cough for about 3-4 weeks     HISTORY OF PRESENT ILLNESS: Acute problem visit today. This is a 32 y.o. male complaining of productive cough that started about 4 weeks ago Denies difficulty breathing.  Denies fever or chills. No other associated symptoms No other complaint or medical concerns today.  Cough Pertinent negatives include no chest pain, chills, fever, headaches, hemoptysis, rash, sore throat, shortness of breath or wheezing.     Prior to Admission medications   Medication Sig Start Date End Date Taking? Authorizing Provider  ALPRAZolam Prudy Feeler) 1 MG tablet Take 1 tablet by mouth 1 hour prior to procedure Patient not taking: Reported on 01/22/2023 10/30/22   Stoneking, Danford Bad., MD  HYDROcodone-acetaminophen (NORCO/VICODIN) 5-325 MG tablet Take 1 tablet by mouth every 6 (six) hours as needed for moderate pain. Patient not taking: Reported on 01/22/2023 12/12/22   Stoneking, Danford Bad., MD    Allergies  Allergen Reactions   Penicillins Itching    Has patient had a PCN reaction causing immediate rash, facial/tongue/throat swelling, SOB or lightheadedness with hypotension: Yes Has patient had a PCN reaction causing severe rash involving mucus membranes or skin necrosis: No Has patient had a PCN reaction that required hospitalization: No Has patient had a PCN reaction occurring within the last 10 years: No If all of the above answers are "NO", then may proceed with Cephalosporin use.    Tetracyclines & Related Itching   Shellfish Allergy     There are no problems to display for this patient.   Past Medical History:  Diagnosis Date   Allergy     No past surgical history on file.  Social History   Socioeconomic History   Marital status: Married    Spouse name: Not on file   Number of children: Not on file   Years of education: Not on file   Highest  education level: Not on file  Occupational History   Not on file  Tobacco Use   Smoking status: Never   Smokeless tobacco: Never  Substance and Sexual Activity   Alcohol use: No   Drug use: No   Sexual activity: Never  Other Topics Concern   Not on file  Social History Narrative   Not on file   Social Determinants of Health   Financial Resource Strain: Not on file  Food Insecurity: Not on file  Transportation Needs: Not on file  Physical Activity: Not on file  Stress: Not on file  Social Connections: Not on file  Intimate Partner Violence: Not on file    No family history on file.   Review of Systems  Constitutional: Negative.  Negative for chills and fever.  HENT: Negative.  Negative for congestion and sore throat.   Respiratory:  Positive for cough and sputum production. Negative for hemoptysis, shortness of breath and wheezing.   Cardiovascular: Negative.  Negative for chest pain and palpitations.  Gastrointestinal: Negative.  Negative for abdominal pain, nausea and vomiting.  Genitourinary: Negative.  Negative for dysuria.  Skin: Negative.  Negative for rash.  Neurological: Negative.  Negative for dizziness and headaches.  All other systems reviewed and are negative.   Vitals:   01/22/23 1020  BP: 110/70  Pulse: 67  Temp: 97.7 F (36.5 C)  SpO2: 95%    Physical Exam Vitals reviewed.  Constitutional:      Appearance:  Normal appearance.  HENT:     Head: Normocephalic.     Mouth/Throat:     Mouth: Mucous membranes are moist.     Pharynx: Oropharynx is clear.  Eyes:     Extraocular Movements: Extraocular movements intact.     Pupils: Pupils are equal, round, and reactive to light.  Cardiovascular:     Rate and Rhythm: Normal rate and regular rhythm.     Pulses: Normal pulses.     Heart sounds: Normal heart sounds.  Pulmonary:     Effort: Pulmonary effort is normal.     Breath sounds: Normal breath sounds.  Abdominal:     Palpations: Abdomen is  soft.     Tenderness: There is no abdominal tenderness.  Musculoskeletal:     Cervical back: No tenderness.  Lymphadenopathy:     Cervical: No cervical adenopathy.  Skin:    General: Skin is warm and dry.     Capillary Refill: Capillary refill takes less than 2 seconds.  Neurological:     General: No focal deficit present.     Mental Status: He is alert and oriented to person, place, and time.  Psychiatric:        Mood and Affect: Mood normal.        Behavior: Behavior normal.      ASSESSMENT & PLAN: A total of 32 minutes was spent with the patient and counseling/coordination of care regarding preparing for this visit, review of most recent office visit notes, diagnosis of lower respiratory infection and need for antibiotics, review of x-ray done today, cough management, prognosis, documentation, and need for follow-up.  Problem List Items Addressed This Visit       Respiratory   Lower respiratory infection - Primary    Starting to develop secondary bacterial infection No signs of pneumonia.  Clinically stable. No red flag signs or symptoms Will benefit from daily azithromycin for 5 days ED precautions given Advised to rest and stay well-hydrated Advised to contact the office if no better or worse during the next several days      Relevant Medications   azithromycin (ZITHROMAX) 250 MG tablet     Other   Persistent cough    Cough management discussed Recommend to take over-the-counter Mucinex DM along with cough drops Tessalon 200 mg 2 or 3 times a day as prescribed Advised to rest and stay well-hydrated      Relevant Medications   benzonatate (TESSALON) 200 MG capsule   Other Relevant Orders   DG Chest 2 View   Patient Instructions  Cough, Adult A cough helps to clear your throat and lungs. It may be a sign of an illness or another condition. A short-term (acute) cough may last 2-3 weeks. A long-term (chronic) cough may last 8 or more weeks. Many things can  cause a cough. They include: Illnesses such as: An infection in your throat or lungs. Asthma or other heart or lung problems. Gastroesophageal reflux. This is when acid comes back up from your stomach. Breathing in things that bother (irritate) your lungs. Allergies. Postnasal drip. This is when mucus runs down the back of your throat. Smoking. Some medicines. Follow these instructions at home: Medicines Take over-the-counter and prescription medicines only as told by your doctor. Talk with your doctor before you take cough medicine (cough suppressants). Eating and drinking Do not drink alcohol. Do not drink caffeine. Drink enough fluid to keep your pee (urine) pale yellow. Lifestyle Stay away from cigarette smoke. Do not smoke  or use any products that contain nicotine or tobacco. If you need help quitting, ask your doctor. Stay away from things that make you cough. These may include perfume, candles, cleaning products, or campfire smoke. General instructions  Watch for any changes to your cough. Tell your doctor about them. Always cover your mouth when you cough. If the air is dry in your home, use a cool mist vaporizer or humidifier. If your cough is worse at night, try using extra pillows to raise your head up higher while you sleep. Rest as needed. Contact a doctor if: You have new symptoms. Your symptoms get worse. You cough up pus. You have a fever that does not go away. Your cough does not get better after 2-3 weeks. Cough medicine does not help, and you are not sleeping well. You have pain that gets worse or is not helped with medicine. You are losing weight and do not know why. You have night sweats. Get help right away if: You cough up blood. You have trouble breathing. Your heart is beating very fast. These symptoms may be an emergency. Get help right away. Call 911. Do not wait to see if the symptoms will go away. Do not drive yourself to the hospital. This  information is not intended to replace advice given to you by your health care provider. Make sure you discuss any questions you have with your health care provider. Document Revised: 03/30/2022 Document Reviewed: 03/30/2022 Elsevier Patient Education  2024 Elsevier Inc.    Edwina Barth, MD [facility]

## 2023-01-22 NOTE — Assessment & Plan Note (Signed)
Cough management discussed Recommend to take over-the-counter Mucinex DM along with cough drops Tessalon 200 mg 2 or 3 times a day as prescribed Advised to rest and stay well-hydrated

## 2023-01-22 NOTE — Patient Instructions (Signed)
Cough, Adult A cough helps to clear your throat and lungs. It may be a sign of an illness or another condition. A short-term (acute) cough may last 2-3 weeks. A long-term (chronic) cough may last 8 or more weeks. Many things can cause a cough. They include: Illnesses such as: An infection in your throat or lungs. Asthma or other heart or lung problems. Gastroesophageal reflux. This is when acid comes back up from your stomach. Breathing in things that bother (irritate) your lungs. Allergies. Postnasal drip. This is when mucus runs down the back of your throat. Smoking. Some medicines. Follow these instructions at home: Medicines Take over-the-counter and prescription medicines only as told by your doctor. Talk with your doctor before you take cough medicine (cough suppressants). Eating and drinking Do not drink alcohol. Do not drink caffeine. Drink enough fluid to keep your pee (urine) pale yellow. Lifestyle Stay away from cigarette smoke. Do not smoke or use any products that contain nicotine or tobacco. If you need help quitting, ask your doctor. Stay away from things that make you cough. These may include perfume, candles, cleaning products, or campfire smoke. General instructions  Watch for any changes to your cough. Tell your doctor about them. Always cover your mouth when you cough. If the air is dry in your home, use a cool mist vaporizer or humidifier. If your cough is worse at night, try using extra pillows to raise your head up higher while you sleep. Rest as needed. Contact a doctor if: You have new symptoms. Your symptoms get worse. You cough up pus. You have a fever that does not go away. Your cough does not get better after 2-3 weeks. Cough medicine does not help, and you are not sleeping well. You have pain that gets worse or is not helped with medicine. You are losing weight and do not know why. You have night sweats. Get help right away if: You cough up  blood. You have trouble breathing. Your heart is beating very fast. These symptoms may be an emergency. Get help right away. Call 911. Do not wait to see if the symptoms will go away. Do not drive yourself to the hospital. This information is not intended to replace advice given to you by your health care provider. Make sure you discuss any questions you have with your health care provider. Document Revised: 03/30/2022 Document Reviewed: 03/30/2022 Elsevier Patient Education  2024 Elsevier Inc.  

## 2023-03-14 ENCOUNTER — Other Ambulatory Visit: Payer: PRIVATE HEALTH INSURANCE

## 2023-03-19 ENCOUNTER — Other Ambulatory Visit: Payer: PRIVATE HEALTH INSURANCE

## 2023-03-19 DIAGNOSIS — Z302 Encounter for sterilization: Secondary | ICD-10-CM

## 2023-06-04 ENCOUNTER — Ambulatory Visit (INDEPENDENT_AMBULATORY_CARE_PROVIDER_SITE_OTHER): Payer: No Typology Code available for payment source | Admitting: Emergency Medicine

## 2023-06-04 ENCOUNTER — Encounter: Payer: Self-pay | Admitting: Emergency Medicine

## 2023-06-04 VITALS — BP 110/70 | HR 84 | Temp 98.1°F | Ht 64.0 in | Wt 113.8 lb

## 2023-06-04 DIAGNOSIS — Z131 Encounter for screening for diabetes mellitus: Secondary | ICD-10-CM

## 2023-06-04 DIAGNOSIS — Z136 Encounter for screening for cardiovascular disorders: Secondary | ICD-10-CM | POA: Diagnosis not present

## 2023-06-04 DIAGNOSIS — Z0000 Encounter for general adult medical examination without abnormal findings: Secondary | ICD-10-CM | POA: Diagnosis not present

## 2023-06-04 DIAGNOSIS — Z13220 Encounter for screening for lipoid disorders: Secondary | ICD-10-CM

## 2023-06-04 DIAGNOSIS — Z23 Encounter for immunization: Secondary | ICD-10-CM | POA: Diagnosis not present

## 2023-06-04 DIAGNOSIS — Z130 Encounter for screening for diseases of the blood and blood-forming organs and certain disorders involving the immune mechanism: Secondary | ICD-10-CM

## 2023-06-04 LAB — CBC WITH DIFFERENTIAL/PLATELET
Basophils Absolute: 0 10*3/uL (ref 0.0–0.1)
Basophils Relative: 0.3 % (ref 0.0–3.0)
Eosinophils Absolute: 0.1 10*3/uL (ref 0.0–0.7)
Eosinophils Relative: 1.5 % (ref 0.0–5.0)
HCT: 44 % (ref 39.0–52.0)
Hemoglobin: 14.6 g/dL (ref 13.0–17.0)
Lymphocytes Relative: 41.3 % (ref 12.0–46.0)
Lymphs Abs: 3.1 10*3/uL (ref 0.7–4.0)
MCHC: 33.2 g/dL (ref 30.0–36.0)
MCV: 88 fL (ref 78.0–100.0)
Monocytes Absolute: 0.4 10*3/uL (ref 0.1–1.0)
Monocytes Relative: 5.8 % (ref 3.0–12.0)
Neutro Abs: 3.9 10*3/uL (ref 1.4–7.7)
Neutrophils Relative %: 51.1 % (ref 43.0–77.0)
Platelets: 378 10*3/uL (ref 150.0–400.0)
RBC: 5 Mil/uL (ref 4.22–5.81)
RDW: 12.9 % (ref 11.5–15.5)
WBC: 7.6 10*3/uL (ref 4.0–10.5)

## 2023-06-04 LAB — COMPREHENSIVE METABOLIC PANEL
ALT: 26 U/L (ref 0–53)
AST: 17 U/L (ref 0–37)
Albumin: 4.6 g/dL (ref 3.5–5.2)
Alkaline Phosphatase: 48 U/L (ref 39–117)
BUN: 15 mg/dL (ref 6–23)
CO2: 29 meq/L (ref 19–32)
Calcium: 9.3 mg/dL (ref 8.4–10.5)
Chloride: 106 meq/L (ref 96–112)
Creatinine, Ser: 0.91 mg/dL (ref 0.40–1.50)
GFR: 109.97 mL/min (ref >60.00)
Glucose, Bld: 84 mg/dL (ref 70–99)
Potassium: 4.3 meq/L (ref 3.5–5.1)
Sodium: 141 meq/L (ref 135–145)
Total Bilirubin: 0.7 mg/dL (ref 0.2–1.2)
Total Protein: 7.5 g/dL (ref 6.0–8.3)

## 2023-06-04 LAB — LIPID PANEL
Cholesterol: 168 mg/dL (ref 0–200)
HDL: 38.5 mg/dL — ABNORMAL LOW (ref >39.00)
LDL Cholesterol: 86 mg/dL (ref 0–99)
NonHDL: 129.33
Total CHOL/HDL Ratio: 4
Triglycerides: 216 mg/dL — ABNORMAL HIGH (ref 0.0–149.0)
VLDL: 43.2 mg/dL — ABNORMAL HIGH (ref 0.0–40.0)

## 2023-06-04 LAB — HEMOGLOBIN A1C: Hgb A1c MFr Bld: 5.3 % (ref 4.6–6.5)

## 2023-06-04 NOTE — Progress Notes (Signed)
Randy Roberts 32 y.o.   Chief Complaint  Patient presents with   Annual Exam    No other concerns. Patient brought immunizations records wants to know which ones he needs to be up to date     HISTORY OF PRESENT ILLNESS: This is a 32 y.o. male here for annual exam. Has no complaints or medical concerns today. Overall doing well.  HPI   Prior to Admission medications   Medication Sig Start Date End Date Taking? Authorizing Provider  ALPRAZolam Prudy Feeler) 1 MG tablet Take 1 tablet by mouth 1 hour prior to procedure Patient not taking: Reported on 01/22/2023 10/30/22   Milderd Meager., MD  azithromycin [facility]) 250 MG tablet Sig as indicated Patient not taking: Reported on 06/04/2023 01/22/23   Georgina Quint, MD  benzonatate (TESSALON) 200 MG capsule Take 1 capsule (200 mg total) by mouth 2 (two) times daily as needed for cough. Patient not taking: Reported on 06/04/2023 01/22/23   Georgina Quint, MD  HYDROcodone-acetaminophen (NORCO/VICODIN) 5-325 MG tablet Take 1 tablet by mouth every 6 (six) hours as needed for moderate pain. Patient not taking: Reported on 01/22/2023 12/12/22   Stoneking, Danford Bad., MD    Allergies  Allergen Reactions   Penicillins Itching    Has patient had a PCN reaction causing immediate rash, facial/tongue/throat swelling, SOB or lightheadedness with hypotension: Yes Has patient had a PCN reaction causing severe rash involving mucus membranes or skin necrosis: No Has patient had a PCN reaction that required hospitalization: No Has patient had a PCN reaction occurring within the last 10 years: No If all of the above answers are "NO", then may proceed with Cephalosporin use.    Tetracyclines & Related Itching   Shellfish Allergy     Patient Active Problem List   Diagnosis Date Noted   Lower respiratory infection 01/22/2023   Persistent cough 10/10/2021    Past Medical History:  Diagnosis Date   Allergy     No past surgical history  on file.  Social History   Socioeconomic History   Marital status: Married    Spouse name: Not on file   Number of children: Not on file   Years of education: Not on file   Highest education level: Not on file  Occupational History   Not on file  Tobacco Use   Smoking status: Never   Smokeless tobacco: Never  Substance and Sexual Activity   Alcohol use: No   Drug use: No   Sexual activity: Never  Other Topics Concern   Not on file  Social History Narrative   Not on file   Social Determinants of Health   Financial Resource Strain: Not on file  Food Insecurity: Not on file  Transportation Needs: Not on file  Physical Activity: Not on file  Stress: Not on file  Social Connections: Not on file  Intimate Partner Violence: Not on file    No family history on file.   Review of Systems  Constitutional: Negative.  Negative for chills and fever.  HENT: Negative.  Negative for congestion and sore throat.   Respiratory: Negative.  Negative for cough and shortness of breath.   Cardiovascular: Negative.  Negative for chest pain and palpitations.  Gastrointestinal:  Negative for abdominal pain, diarrhea, nausea and vomiting.  Genitourinary: Negative.  Negative for dysuria and hematuria.  Skin: Negative.  Negative for rash.  Neurological: Negative.  Negative for dizziness and headaches.  All other systems reviewed and are negative.  Vitals:   06/04/23 1008  BP: 110/70  Pulse: 84  Temp: 98.1 F (36.7 C)  SpO2: 96%    Physical Exam Vitals reviewed.  Constitutional:      Appearance: Normal appearance.  HENT:     Head: Normocephalic.     Right Ear: Tympanic membrane, ear canal and external ear normal.     Left Ear: Tympanic membrane, ear canal and external ear normal.     Mouth/Throat:     Mouth: Mucous membranes are moist.     Pharynx: Oropharynx is clear.  Eyes:     Extraocular Movements: Extraocular movements intact.     Conjunctiva/sclera: Conjunctivae normal.      Pupils: Pupils are equal, round, and reactive to light.  Cardiovascular:     Rate and Rhythm: Normal rate and regular rhythm.     Pulses: Normal pulses.     Heart sounds: Normal heart sounds.  Pulmonary:     Effort: Pulmonary effort is normal.     Breath sounds: Normal breath sounds.  Abdominal:     Palpations: Abdomen is soft.     Tenderness: There is no abdominal tenderness.  Musculoskeletal:     Cervical back: No tenderness.  Lymphadenopathy:     Cervical: No cervical adenopathy.  Skin:    General: Skin is warm and dry.     Capillary Refill: Capillary refill takes less than 2 seconds.  Neurological:     General: No focal deficit present.     Mental Status: He is alert and oriented to person, place, and time.  Psychiatric:        Mood and Affect: Mood normal.        Behavior: Behavior normal.      ASSESSMENT & PLAN: Problem List Items Addressed This Visit   None Visit Diagnoses     Routine general medical examination at a health care facility    -  Primary   Relevant Orders   CBC with Differential   Comprehensive metabolic panel   Hemoglobin A1c   Lipid panel   Screening for deficiency anemia       Relevant Orders   CBC with Differential   Screening for lipoid disorders       Relevant Orders   Lipid panel   Screening for endocrine, metabolic and immunity disorder       Relevant Orders   Comprehensive metabolic panel   Hemoglobin A1c      Modifiable risk factors discussed with patient. Anticipatory guidance according to age provided. The following topics were also discussed: Social Determinants of Health Smoking.  Non-smoker Diet and nutrition Benefits of exercise Cancer family history review Vaccinations review and recommendations Cardiovascular risk assessment and need for blood work Mental health including depression and anxiety Fall and accident prevention  Patient Instructions  Health Maintenance, Male Adopting a healthy lifestyle and  getting preventive care are important in promoting health and wellness. Ask your health care provider about: The right schedule for you to have regular tests and exams. Things you can do on your own to prevent diseases and keep yourself healthy. What should I know about diet, weight, and exercise? Eat a healthy diet  Eat a diet that includes plenty of vegetables, fruits, low-fat dairy products, and lean protein. Do not eat a lot of foods that are high in solid fats, added sugars, or sodium. Maintain a healthy weight Body mass index (BMI) is a measurement that can be used to identify possible weight problems. It estimates body  fat based on height and weight. Your health care provider can help determine your BMI and help you achieve or maintain a healthy weight. Get regular exercise Get regular exercise. This is one of the most important things you can do for your health. Most adults should: Exercise for at least 150 minutes each week. The exercise should increase your heart rate and make you sweat (moderate-intensity exercise). Do strengthening exercises at least twice a week. This is in addition to the moderate-intensity exercise. Spend less time sitting. Even light physical activity can be beneficial. Watch cholesterol and blood lipids Have your blood tested for lipids and cholesterol at 34 years of age, then have this test every 5 years. You may need to have your cholesterol levels checked more often if: Your lipid or cholesterol levels are high. You are older than 34 years of age. You are at high risk for heart disease. What should I know about cancer screening? Many types of cancers can be detected early and may often be prevented. Depending on your health history and family history, you may need to have cancer screening at various ages. This may include screening for: Colorectal cancer. Prostate cancer. Skin cancer. Lung cancer. What should I know about heart disease, diabetes, and  high blood pressure? Blood pressure and heart disease High blood pressure causes heart disease and increases the risk of stroke. This is more likely to develop in people who have high blood pressure readings or are overweight. Talk with your health care provider about your target blood pressure readings. Have your blood pressure checked: Every 3-5 years if you are 66-81 years of age. Every year if you are 9 years old or older. If you are between the ages of 15 and 17 and are a current or former smoker, ask your health care provider if you should have a one-time screening for abdominal aortic aneurysm (AAA). Diabetes Have regular diabetes screenings. This checks your fasting blood sugar level. Have the screening done: Once every three years after age 52 if you are at a normal weight and have a low risk for diabetes. More often and at a younger age if you are overweight or have a high risk for diabetes. What should I know about preventing infection? Hepatitis B If you have a higher risk for hepatitis B, you should be screened for this virus. Talk with your health care provider to find out if you are at risk for hepatitis B infection. Hepatitis C Blood testing is recommended for: Everyone born from [birth year] through 1967. Anyone with known risk factors for hepatitis C. Sexually transmitted infections (STIs) You should be screened each year for STIs, including gonorrhea and chlamydia, if: You are sexually active and are younger than 34 years of age. You are older than 34 years of age and your health care provider tells you that you are at risk for this type of infection. Your sexual activity has changed since you were last screened, and you are at increased risk for chlamydia or gonorrhea. Ask your health care provider if you are at risk. Ask your health care provider about whether you are at high risk for HIV. Your health care provider may recommend a prescription medicine to help prevent HIV  infection. If you choose to take medicine to prevent HIV, you should first get tested for HIV. You should then be tested every 3 months for as long as you are taking the medicine. Follow these instructions at home: Alcohol use Do not drink alcohol  if your health care provider tells you not to drink. If you drink alcohol: Limit how much you have to 0-2 drinks a day. Know how much alcohol is in your drink. In the [place] one drink equals one 12 oz bottle of beer (355 mL), one 5 oz glass of wine (148 mL), or one 1 oz glass of hard liquor (44 mL). Lifestyle Do not use any products that contain nicotine or tobacco. These products include cigarettes, chewing tobacco, and vaping devices, such as e-cigarettes. If you need help quitting, ask your health care provider. Do not use street drugs. Do not share needles. Ask your health care provider for help if you need support or information about quitting drugs. General instructions Schedule regular health, dental, and eye exams. Stay current with your vaccines. Tell your health care provider if: You often feel depressed. You have ever been abused or do not feel safe at home. Summary Adopting a healthy lifestyle and getting preventive care are important in promoting health and wellness. Follow your health care provider's instructions about healthy diet, exercising, and getting tested or screened for diseases. Follow your health care provider's instructions on monitoring your cholesterol and blood pressure. This information is not intended to replace advice given to you by your health care provider. Make sure you discuss any questions you have with your health care provider. Document Revised: 12/19/2020 Document Reviewed: 12/19/2020 Elsevier Patient Education  2024 Elsevier Inc.     Edwina Barth, MD [facility]

## 2023-06-04 NOTE — Patient Instructions (Signed)
Health Maintenance, Male Adopting a healthy lifestyle and getting preventive care are important in promoting health and wellness. Ask your health care provider about: The right schedule for you to have regular tests and exams. Things you can do on your own to prevent diseases and keep yourself healthy. What should I know about diet, weight, and exercise? Eat a healthy diet  Eat a diet that includes plenty of vegetables, fruits, low-fat dairy products, and lean protein. Do not eat a lot of foods that are high in solid fats, added sugars, or sodium. Maintain a healthy weight Body mass index (BMI) is a measurement that can be used to identify possible weight problems. It estimates body fat based on height and weight. Your health care provider can help determine your BMI and help you achieve or maintain a healthy weight. Get regular exercise Get regular exercise. This is one of the most important things you can do for your health. Most adults should: Exercise for at least 150 minutes each week. The exercise should increase your heart rate and make you sweat (moderate-intensity exercise). Do strengthening exercises at least twice a week. This is in addition to the moderate-intensity exercise. Spend less time sitting. Even light physical activity can be beneficial. Watch cholesterol and blood lipids Have your blood tested for lipids and cholesterol at 34 years of age, then have this test every 5 years. You may need to have your cholesterol levels checked more often if: Your lipid or cholesterol levels are high. You are older than 34 years of age. You are at high risk for heart disease. What should I know about cancer screening? Many types of cancers can be detected early and may often be prevented. Depending on your health history and family history, you may need to have cancer screening at various ages. This may include screening for: Colorectal cancer. Prostate cancer. Skin cancer. Lung  cancer. What should I know about heart disease, diabetes, and high blood pressure? Blood pressure and heart disease High blood pressure causes heart disease and increases the risk of stroke. This is more likely to develop in people who have high blood pressure readings or are overweight. Talk with your health care provider about your target blood pressure readings. Have your blood pressure checked: Every 3-5 years if you are 18-39 years of age. Every year if you are 40 years old or older. If you are between the ages of 65 and 75 and are a current or former smoker, ask your health care provider if you should have a one-time screening for abdominal aortic aneurysm (AAA). Diabetes Have regular diabetes screenings. This checks your fasting blood sugar level. Have the screening done: Once every three years after age 45 if you are at a normal weight and have a low risk for diabetes. More often and at a younger age if you are overweight or have a high risk for diabetes. What should I know about preventing infection? Hepatitis B If you have a higher risk for hepatitis B, you should be screened for this virus. Talk with your health care provider to find out if you are at risk for hepatitis B infection. Hepatitis C Blood testing is recommended for: Everyone born from 1945 through 1965. Anyone with known risk factors for hepatitis C. Sexually transmitted infections (STIs) You should be screened each year for STIs, including gonorrhea and chlamydia, if: You are sexually active and are younger than 34 years of age. You are older than 34 years of age and your   health care provider tells you that you are at risk for this type of infection. Your sexual activity has changed since you were last screened, and you are at increased risk for chlamydia or gonorrhea. Ask your health care provider if you are at risk. Ask your health care provider about whether you are at high risk for HIV. Your health care provider  may recommend a prescription medicine to help prevent HIV infection. If you choose to take medicine to prevent HIV, you should first get tested for HIV. You should then be tested every 3 months for as long as you are taking the medicine. Follow these instructions at home: Alcohol use Do not drink alcohol if your health care provider tells you not to drink. If you drink alcohol: Limit how much you have to 0-2 drinks a day. Know how much alcohol is in your drink. In the [place] one drink equals one 12 oz bottle of beer (355 mL), one 5 oz glass of wine (148 mL), or one 1 oz glass of hard liquor (44 mL). Lifestyle Do not use any products that contain nicotine or tobacco. These products include cigarettes, chewing tobacco, and vaping devices, such as e-cigarettes. If you need help quitting, ask your health care provider. Do not use street drugs. Do not share needles. Ask your health care provider for help if you need support or information about quitting drugs. General instructions Schedule regular health, dental, and eye exams. Stay current with your vaccines. Tell your health care provider if: You often feel depressed. You have ever been abused or do not feel safe at home. Summary Adopting a healthy lifestyle and getting preventive care are important in promoting health and wellness. Follow your health care provider's instructions about healthy diet, exercising, and getting tested or screened for diseases. Follow your health care provider's instructions on monitoring your cholesterol and blood pressure. This information is not intended to replace advice given to you by your health care provider. Make sure you discuss any questions you have with your health care provider. Document Revised: 12/19/2020 Document Reviewed: 12/19/2020 Elsevier Patient Education  2024 [employer]  

## 2023-07-01 ENCOUNTER — Encounter (INDEPENDENT_AMBULATORY_CARE_PROVIDER_SITE_OTHER): Payer: PRIVATE HEALTH INSURANCE

## 2023-10-29 ENCOUNTER — Encounter: Payer: PRIVATE HEALTH INSURANCE | Admitting: Internal Medicine

## 2023-11-05 ENCOUNTER — Encounter: Payer: Self-pay | Admitting: Internal Medicine

## 2023-11-05 ENCOUNTER — Ambulatory Visit (INDEPENDENT_AMBULATORY_CARE_PROVIDER_SITE_OTHER): Payer: PRIVATE HEALTH INSURANCE | Admitting: Internal Medicine

## 2023-11-05 VITALS — BP 102/62 | HR 76 | Ht 64.0 in | Wt 115.2 lb

## 2023-11-05 DIAGNOSIS — Z024 Encounter for examination for driving license: Secondary | ICD-10-CM

## 2023-11-05 NOTE — Progress Notes (Signed)
 Commercial Driver Medical Examination   Randy Roberts is a 33 y.o. male who presents today for a commercial driver fitness determination physical exam. The patient reports no problems. The following portions of the patient's history were reviewed and updated as appropriate: allergies, current medications, past family history, past medical history, past social history, past surgical history, and problem list. Review of Systems  Past Medical History:  Diagnosis Date   Allergy      Current Outpatient Medications  Medication Sig Dispense Refill   ALPRAZolam (XANAX) 1 MG tablet Take 1 tablet by mouth 1 hour prior to procedure (Patient not taking: Reported on 01/22/2023) 1 tablet 0   azithromycin (ZITHROMAX) 250 MG tablet Sig as indicated (Patient not taking: Reported on 06/04/2023) 6 tablet 0   benzonatate (TESSALON) 200 MG capsule Take 1 capsule (200 mg total) by mouth 2 (two) times daily as needed for cough. (Patient not taking: Reported on 06/04/2023) 20 capsule 0   HYDROcodone-acetaminophen (NORCO/VICODIN) 5-325 MG tablet Take 1 tablet by mouth every 6 (six) hours as needed for moderate pain. (Patient not taking: Reported on 01/22/2023) 10 tablet 0   No current facility-administered medications for this visit.    Allergies  Allergen Reactions   Penicillins Itching    Has patient had a PCN reaction causing immediate rash, facial/tongue/throat swelling, SOB or lightheadedness with hypotension: Yes Has patient had a PCN reaction causing severe rash involving mucus membranes or skin necrosis: No Has patient had a PCN reaction that required hospitalization: No Has patient had a PCN reaction occurring within the last 10 years: No If all of the above answers are "NO", then may proceed with Cephalosporin use.    Tetracyclines & Related Itching   Shellfish Allergy     No family history on file.  Social History   Socioeconomic History   Marital status: Married    Spouse name: Not on file    Number of children: Not on file   Years of education: Not on file   Highest education level: Not on file  Occupational History   Not on file  Tobacco Use   Smoking status: Never   Smokeless tobacco: Never  Substance and Sexual Activity   Alcohol use: No   Drug use: No   Sexual activity: Never  Other Topics Concern   Not on file  Social History Narrative   Not on file   Social Drivers of Health   Financial Resource Strain: Not on file  Food Insecurity: Not on file  Transportation Needs: Not on file  Physical Activity: Not on file  Stress: Not on file  Social Connections: Not on file  Intimate Partner Violence: Not on file     Constitutional: Denies fever, malaise, fatigue, headache or abrupt weight changes.  HEENT: Denies eye pain, eye redness, ear pain, ringing in the ears, wax buildup, runny nose, nasal congestion, bloody nose, or sore throat. Respiratory: Denies difficulty breathing, shortness of breath, cough or sputum production.   Cardiovascular: Denies chest pain, chest tightness, palpitations or swelling in the hands or feet.  Gastrointestinal: Denies abdominal pain, bloating, constipation, diarrhea or blood in the stool.  GU: Denies urgency, frequency, pain with urination, burning sensation, blood in urine, odor or discharge. Musculoskeletal: Denies decrease in range of motion, difficulty with gait, muscle pain or joint pain and swelling.  Skin: Denies redness, rashes, lesions or ulcercations.  Neurological: Denies dizziness, difficulty with memory, difficulty with speech or problems with balance and coordination.  Psych: Denies anxiety, depression,  SI/HI.  No other specific complaints in a complete review of systems (except as listed in HPI above).   Objective:    Vision:  Uncorrected Corrected Horizontal Field of Vision  Right Eye  20/20 70 degrees  Left Eye   20/20 70 degrees  Both Eyes   20/20    Applicant can recognize and distinguish among traffic  control signals and devices showing standard red, green, and amber colors.  Applicant meets visual acuity requirement only when wearing corrective lenses.  Monocular Vision?: No   Hearing:        Right Ear  > 65ft     Left Ear  > 32ft         BP 102/62 (BP Location: Left Arm, Patient Position: Sitting, Cuff Size: Normal)   Pulse 76   Ht 5\' 4"  (1.626 m)   Wt 115 lb 3.2 oz (52.3 kg)   SpO2 95%   BMI 19.77 kg/m   Wt Readings from Last 3 Encounters:  06/04/23 113 lb 12.8 oz (51.6 kg)  01/22/23 110 lb 4 oz (50 kg)  10/30/22 110 lb (49.9 kg)    General: Appears his stated age, well developed, well nourished in NAD. Skin: Warm, dry and intact. No rashes, lesions or ulcerations noted. HEENT: Head: normal shape and size; Eyes: sclera white, no icterus, conjunctiva pink, PERRLA and EOMs intact; Ears: Tm's gray and intact, normal light reflex;  Neck:  Neck supple, trachea midline. No masses, lumps or thyromegaly present.  Cardiovascular: Normal rate and rhythm. S1,S2 noted.  No murmur, rubs or gallops noted. No JVD or BLE edema. Pulmonary/Chest: Normal effort and positive vesicular breath sounds. No respiratory distress. No wheezes, rales or ronchi noted.  Abdomen: Soft and nontender. Normal bowel sounds. No distention or masses noted. Liver, spleen and kidneys non palpable. Musculoskeletal: Normal range of motion. No signs of joint swelling.  Strength 5/5 BUE/BLE.  No difficulty with gait.  Neurological: Alert and oriented. Cranial nerves II-XII grossly intact. Coordination normal.  Psychiatric: Mood and affect normal. Behavior is normal. Judgment and thought content normal.    BMET    Component Value Date/Time   NA 141 06/04/2023 1055   NA 140 04/26/2020 0841   K 4.3 06/04/2023 1055   CL 106 06/04/2023 1055   CO2 29 06/04/2023 1055   GLUCOSE 84 06/04/2023 1055   BUN 15 06/04/2023 1055   BUN 19 04/26/2020 0841   CREATININE 0.91 06/04/2023 1055   CREATININE 0.89 05/16/2015  0852   CALCIUM 9.3 06/04/2023 1055   GFRNONAA >60 11/07/2021 1323   GFRAA 121 04/26/2020 0841    Lipid Panel     Component Value Date/Time   CHOL 168 06/04/2023 1055   CHOL 172 04/26/2020 0841   TRIG 216.0 (H) 06/04/2023 1055   HDL 38.50 (L) 06/04/2023 1055   HDL 40 04/26/2020 0841   CHOLHDL 4 06/04/2023 1055   VLDL 43.2 (H) 06/04/2023 1055   LDLCALC 86 06/04/2023 1055   LDLCALC 98 04/26/2020 0841    CBC    Component Value Date/Time   WBC 7.6 06/04/2023 1055   RBC 5.00 06/04/2023 1055   HGB 14.6 06/04/2023 1055   HGB 14.8 04/26/2020 0841   HCT 44.0 06/04/2023 1055   HCT 44.7 04/26/2020 0841   PLT 378.0 06/04/2023 1055   PLT 307 04/26/2020 0841   MCV 88.0 06/04/2023 1055   MCV 88 04/26/2020 0841   MCH 28.9 11/07/2021 1323   MCHC 33.2 06/04/2023 1055   RDW  12.9 06/04/2023 1055   RDW 12.3 04/26/2020 0841   LYMPHSABS 3.1 06/04/2023 1055   LYMPHSABS 2.6 04/26/2020 0841   MONOABS 0.4 06/04/2023 1055   EOSABS 0.1 06/04/2023 1055   EOSABS 0.2 04/26/2020 0841   BASOSABS 0.0 06/04/2023 1055   BASOSABS 0.0 04/26/2020 0841    Hgb A1C Lab Results  Component Value Date   HGBA1C 5.3 06/04/2023        Labs:  Urinalysis:  Specific gravity: 1.015  Glucose: Negative  Protein: Negative  Blood: Negative    Assessment:    Healthy male exam.  Meets standards in 96 CFR 391.41;  qualifies for 2 year certificate.    Plan:    Medical examiners certificate completed and printed. Return as needed.   Nicki Reaper, NP

## 2024-06-09 ENCOUNTER — Ambulatory Visit: Payer: PRIVATE HEALTH INSURANCE | Admitting: Emergency Medicine

## 2024-06-09 ENCOUNTER — Ambulatory Visit: Payer: Self-pay | Admitting: Emergency Medicine

## 2024-06-09 ENCOUNTER — Encounter: Payer: Self-pay | Admitting: Emergency Medicine

## 2024-06-09 VITALS — BP 100/78 | HR 78 | Temp 97.9°F | Ht 64.0 in | Wt 119.0 lb

## 2024-06-09 DIAGNOSIS — Z13220 Encounter for screening for lipoid disorders: Secondary | ICD-10-CM

## 2024-06-09 DIAGNOSIS — Z0000 Encounter for general adult medical examination without abnormal findings: Secondary | ICD-10-CM | POA: Diagnosis not present

## 2024-06-09 DIAGNOSIS — Z13228 Encounter for screening for other metabolic disorders: Secondary | ICD-10-CM | POA: Diagnosis not present

## 2024-06-09 DIAGNOSIS — Z130 Encounter for screening for diseases of the blood and blood-forming organs and certain disorders involving the immune mechanism: Secondary | ICD-10-CM | POA: Diagnosis not present

## 2024-06-09 DIAGNOSIS — Z1329 Encounter for screening for other suspected endocrine disorder: Secondary | ICD-10-CM | POA: Diagnosis not present

## 2024-06-09 LAB — COMPREHENSIVE METABOLIC PANEL WITH GFR
ALT: 36 U/L (ref 0–53)
AST: 20 U/L (ref 0–37)
Albumin: 4.5 g/dL (ref 3.5–5.2)
Alkaline Phosphatase: 42 U/L (ref 39–117)
BUN: 15 mg/dL (ref 6–23)
CO2: 30 meq/L (ref 19–32)
Calcium: 9 mg/dL (ref 8.4–10.5)
Chloride: 105 meq/L (ref 96–112)
Creatinine, Ser: 0.79 mg/dL (ref 0.40–1.50)
GFR: 115.09 mL/min (ref >60.00)
Glucose, Bld: 81 mg/dL (ref 70–99)
Potassium: 3.7 meq/L (ref 3.5–5.1)
Sodium: 140 meq/L (ref 135–145)
Total Bilirubin: 0.7 mg/dL (ref 0.2–1.2)
Total Protein: 7.1 g/dL (ref 6.0–8.3)

## 2024-06-09 LAB — CBC WITH DIFFERENTIAL/PLATELET
Basophils Absolute: 0 K/uL (ref 0.0–0.1)
Basophils Relative: 0.2 % (ref 0.0–3.0)
Eosinophils Absolute: 0.1 K/uL (ref 0.0–0.7)
Eosinophils Relative: 1.2 % (ref 0.0–5.0)
HCT: 41.7 % (ref 39.0–52.0)
Hemoglobin: 14.1 g/dL (ref 13.0–17.0)
Lymphocytes Relative: 39.1 % (ref 12.0–46.0)
Lymphs Abs: 2 K/uL (ref 0.7–4.0)
MCHC: 33.9 g/dL (ref 30.0–36.0)
MCV: 86.5 fl (ref 78.0–100.0)
Monocytes Absolute: 0.4 K/uL (ref 0.1–1.0)
Monocytes Relative: 7.2 % (ref 3.0–12.0)
Neutro Abs: 2.6 K/uL (ref 1.4–7.7)
Neutrophils Relative %: 52.3 % (ref 43.0–77.0)
Platelets: 289 K/uL (ref 150.0–400.0)
RBC: 4.82 Mil/uL (ref 4.22–5.81)
RDW: 12.8 % (ref 11.5–15.5)
WBC: 5 K/uL (ref 4.0–10.5)

## 2024-06-09 LAB — LIPID PANEL
Cholesterol: 179 mg/dL (ref 0–200)
HDL: 38 mg/dL — ABNORMAL LOW (ref >39.00)
LDL Cholesterol: 85 mg/dL (ref 0–99)
NonHDL: 140.61
Total CHOL/HDL Ratio: 5
Triglycerides: 278 mg/dL — ABNORMAL HIGH (ref 0.0–149.0)
VLDL: 55.6 mg/dL — ABNORMAL HIGH (ref 0.0–40.0)

## 2024-06-09 LAB — HEMOGLOBIN A1C: Hgb A1c MFr Bld: 5.4 % (ref 4.6–6.5)

## 2024-06-09 NOTE — Progress Notes (Signed)
 Randy Roberts 33 y.o.   Chief Complaint  Patient presents with   Annual Exam    HISTORY OF PRESENT ILLNESS: This is a 33 y.o. male here for annual exam Overall doing well.  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 Sleeps well.  No chronic medical conditions.  No chronic medications. No alcohol use.  HPI   Prior to Admission medications   Medication Sig Start Date End Date Taking? Authorizing Provider  Multiple Vitamin (MULTIVITAMIN) capsule Take 1 capsule by mouth daily.   Yes [provider]  ALPRAZolam  (XANAX ) 1 MG tablet Take 1 tablet by mouth 1 hour prior to procedure Patient not taking: Reported on 06/09/2024 10/30/22   Stoneking, Adine PARAS., MD  HYDROcodone -acetaminophen  (NORCO/VICODIN) 5-325 MG tablet Take 1 tablet by mouth every 6 (six) hours as needed for moderate pain. 12/12/22   Stoneking, Adine PARAS., MD    Allergies  Allergen Reactions   Penicillins Itching    Has patient had a PCN reaction causing immediate rash, facial/tongue/throat swelling, SOB or lightheadedness with hypotension: Yes Has patient had a PCN reaction causing severe rash involving mucus membranes or skin necrosis: No Has patient had a PCN reaction that required hospitalization: No Has patient had a PCN reaction occurring within the last 10 years: No If all of the above answers are NO, then may proceed with Cephalosporin use.    Tetracyclines & Related Itching   Shellfish Allergy     There are no active problems to display for this patient.   Past Medical History:  Diagnosis Date   Allergy     No past surgical history on file.  Social History   Socioeconomic History   Marital status: Married    Spouse name: Not on file   Number of children: Not on file   Years of education: Not on file   Highest education level: Associate degree: occupational, scientist, product/process development, or vocational program  Occupational History   Not on file  Tobacco Use   Smoking status:  Never   Smokeless tobacco: Never  Substance and Sexual Activity   Alcohol use: No   Drug use: No   Sexual activity: Never  Other Topics Concern   Not on file  Social History Narrative   Not on file   Social Drivers of Health   Financial Resource Strain: Low Risk  (06/08/2024)   Overall Financial Resource Strain (CARDIA)    Difficulty of Paying Living Expenses: Not hard at all  Food Insecurity: No Food Insecurity (06/08/2024)   Hunger Vital Sign    Worried About Running Out of Food in the Last Year: Never true    Ran Out of Food in the Last Year: Never true  Transportation Needs: No Transportation Needs (06/08/2024)   PRAPARE - Administrator, Civil Service (Medical): No    Lack of Transportation (Non-Medical): No  Physical Activity: Insufficiently Active (06/08/2024)   Exercise Vital Sign    Days of Exercise per Week: 1 day    Minutes of Exercise per Session: 30 min  Stress: No Stress Concern Present (06/08/2024)   [employer] of Occupational Health - Occupational Stress Questionnaire    Feeling of Stress: Not at all  Social Connections: Socially Integrated (06/08/2024)   Social Connection and Isolation Panel    Frequency of Communication with Friends and Family: More than three times a week    Frequency of Social Gatherings with Friends and Family: More than three times a week  Attends Religious Services: 1 to 4 times per year    Active Member of Clubs or Organizations: Yes    Attends Banker Meetings: 1 to 4 times per year    Marital Status: Married  Catering Manager Violence: Not on file    No family history on file.   Review of Systems  Constitutional: Negative.  Negative for chills and fever.  HENT: Negative.  Negative for congestion and sore throat.   Respiratory: Negative.  Negative for cough and shortness of breath.   Cardiovascular: Negative.  Negative for chest pain and palpitations.  Gastrointestinal:  Negative for abdominal  pain, diarrhea, nausea and vomiting.  Genitourinary: Negative.  Negative for dysuria and hematuria.  Skin: Negative.  Negative for rash.  Neurological: Negative.  Negative for dizziness and headaches.  All other systems reviewed and are negative.   Vitals:   06/09/24 0905  BP: 100/78  Pulse: 78  Temp: 97.9 F (36.6 C)  SpO2: 98%    Physical Exam Vitals reviewed.  Constitutional:      Appearance: Normal appearance.  HENT:     Head: Normocephalic.     Right Ear: Tympanic membrane, ear canal and external ear normal.     Left Ear: Tympanic membrane, ear canal and external ear normal.     Mouth/Throat:     Mouth: Mucous membranes are moist.     Pharynx: Oropharynx is clear.  Eyes:     Extraocular Movements: Extraocular movements intact.     Pupils: Pupils are equal, round, and reactive to light.  Cardiovascular:     Rate and Rhythm: Normal rate and regular rhythm.     Pulses: Normal pulses.     Heart sounds: Normal heart sounds.  Pulmonary:     Effort: Pulmonary effort is normal.     Breath sounds: Normal breath sounds.  Abdominal:     Palpations: Abdomen is soft.     Tenderness: There is no abdominal tenderness.  Musculoskeletal:     Cervical back: No tenderness.  Lymphadenopathy:     Cervical: No cervical adenopathy.  Skin:    General: Skin is warm and dry.     Capillary Refill: Capillary refill takes less than 2 seconds.  Neurological:     General: No focal deficit present.     Mental Status: He is alert and oriented to person, place, and time.  Psychiatric:        Mood and Affect: Mood normal.        Behavior: Behavior normal.      ASSESSMENT & PLAN: Problem List Items Addressed This Visit   None Visit Diagnoses       Routine general medical examination at a health care facility    -  Primary   Relevant Orders   CBC with Differential/Platelet   Comprehensive metabolic panel with GFR   Hemoglobin A1c   Lipid panel     Screening for deficiency anemia        Relevant Orders   CBC with Differential/Platelet     Screening for lipoid disorders       Relevant Orders   Lipid panel     Screening for endocrine, metabolic and immunity disorder       Relevant Orders   Comprehensive metabolic panel with GFR   Hemoglobin A1c      Modifiable risk factors discussed with patient. Anticipatory guidance according to age provided. The following topics were also discussed: Social Determinants of Health Smoking.  Non-smoker Diet and  nutrition Benefits of exercise Cancer family history review Vaccinations review and recommendations Cardiovascular risk assessment and need for blood work Mental health including depression and anxiety Fall and accident prevention  Patient Instructions  Health Maintenance, Male Adopting a healthy lifestyle and getting preventive care are important in promoting health and wellness. Ask your health care provider about: The right schedule for you to have regular tests and exams. Things you can do on your own to prevent diseases and keep yourself healthy. What should I know about diet, weight, and exercise? Eat a healthy diet  Eat a diet that includes plenty of vegetables, fruits, low-fat dairy products, and lean protein. Do not eat a lot of foods that are high in solid fats, added sugars, or sodium. Maintain a healthy weight Body mass index (BMI) is a measurement that can be used to identify possible weight problems. It estimates body fat based on height and weight. Your health care provider can help determine your BMI and help you achieve or maintain a healthy weight. Get regular exercise Get regular exercise. This is one of the most important things you can do for your health. Most adults should: Exercise for at least 150 minutes each week. The exercise should increase your heart rate and make you sweat (moderate-intensity exercise). Do strengthening exercises at least twice a week. This is in addition to the  moderate-intensity exercise. Spend less time sitting. Even light physical activity can be beneficial. Watch cholesterol and blood lipids Have your blood tested for lipids and cholesterol at 35 years of age, then have this test every 5 years. You may need to have your cholesterol levels checked more often if: Your lipid or cholesterol levels are high. You are older than 35 years of age. You are at high risk for heart disease. What should I know about cancer screening? Many types of cancers can be detected early and may often be prevented. Depending on your health history and family history, you may need to have cancer screening at various ages. This may include screening for: Colorectal cancer. Prostate cancer. Skin cancer. Lung cancer. What should I know about heart disease, diabetes, and high blood pressure? Blood pressure and heart disease High blood pressure causes heart disease and increases the risk of stroke. This is more likely to develop in people who have high blood pressure readings or are overweight. Talk with your health care provider about your target blood pressure readings. Have your blood pressure checked: Every 3-5 years if you are 79-49 years of age. Every year if you are 23 years old or older. If you are between the ages of 60 and 40 and are a current or former smoker, ask your health care provider if you should have a one-time screening for abdominal aortic aneurysm (AAA). Diabetes Have regular diabetes screenings. This checks your fasting blood sugar level. Have the screening done: Once every three years after age 21 if you are at a normal weight and have a low risk for diabetes. More often and at a younger age if you are overweight or have a high risk for diabetes. What should I know about preventing infection? Hepatitis B If you have a higher risk for hepatitis B, you should be screened for this virus. Talk with your health care provider to find out if you are at  risk for hepatitis B infection. Hepatitis C Blood testing is recommended for: Everyone born from [birth year] through 1967. Anyone with known risk factors for hepatitis C. Sexually transmitted infections (STIs)  You should be screened each year for STIs, including gonorrhea and chlamydia, if: You are sexually active and are younger than 35 years of age. You are older than 35 years of age and your health care provider tells you that you are at risk for this type of infection. Your sexual activity has changed since you were last screened, and you are at increased risk for chlamydia or gonorrhea. Ask your health care provider if you are at risk. Ask your health care provider about whether you are at high risk for HIV. Your health care provider may recommend a prescription medicine to help prevent HIV infection. If you choose to take medicine to prevent HIV, you should first get tested for HIV. You should then be tested every 3 months for as long as you are taking the medicine. Follow these instructions at home: Alcohol use Do not drink alcohol if your health care provider tells you not to drink. If you drink alcohol: Limit how much you have to 0-2 drinks a day. Know how much alcohol is in your drink. In the [place] one drink equals one 12 oz bottle of beer (355 mL), one 5 oz glass of wine (148 mL), or one 1 oz glass of hard liquor (44 mL). Lifestyle Do not use any products that contain nicotine or tobacco. These products include cigarettes, chewing tobacco, and vaping devices, such as e-cigarettes. If you need help quitting, ask your health care provider. Do not use street drugs. Do not share needles. Ask your health care provider for help if you need support or information about quitting drugs. General instructions Schedule regular health, dental, and eye exams. Stay current with your vaccines. Tell your health care provider if: You often feel depressed. You have ever been abused or do not feel safe  at home. Summary Adopting a healthy lifestyle and getting preventive care are important in promoting health and wellness. Follow your health care provider's instructions about healthy diet, exercising, and getting tested or screened for diseases. Follow your health care provider's instructions on monitoring your cholesterol and blood pressure. This information is not intended to replace advice given to you by your health care provider. Make sure you discuss any questions you have with your health care provider. Document Revised: 12/19/2020 Document Reviewed: 12/19/2020 Elsevier Patient Education  2024 Elsevier Inc.    Emil Schaumann, MD [facility]

## 2024-06-09 NOTE — Patient Instructions (Signed)
 Health Maintenance, Male  Adopting a healthy lifestyle and getting preventive care are important in promoting health and wellness. Ask your health care provider about:  The right schedule for you to have regular tests and exams.  Things you can do on your own to prevent diseases and keep yourself healthy.  What should I know about diet, weight, and exercise?  Eat a healthy diet    Eat a diet that includes plenty of vegetables, fruits, low-fat dairy products, and lean protein.  Do not eat a lot of foods that are high in solid fats, added sugars, or sodium.  Maintain a healthy weight  Body mass index (BMI) is a measurement that can be used to identify possible weight problems. It estimates body fat based on height and weight. Your health care provider can help determine your BMI and help you achieve or maintain a healthy weight.  Get regular exercise  Get regular exercise. This is one of the most important things you can do for your health. Most adults should:  Exercise for at least 150 minutes each week. The exercise should increase your heart rate and make you sweat (moderate-intensity exercise).  Do strengthening exercises at least twice a week. This is in addition to the moderate-intensity exercise.  Spend less time sitting. Even light physical activity can be beneficial.  Watch cholesterol and blood lipids  Have your blood tested for lipids and cholesterol at 35 years of age, then have this test every 5 years.  You may need to have your cholesterol levels checked more often if:  Your lipid or cholesterol levels are high.  You are older than 35 years of age.  You are at high risk for heart disease.  What should I know about cancer screening?  Many types of cancers can be detected early and may often be prevented. Depending on your health history and family history, you may need to have cancer screening at various ages. This may include screening for:  Colorectal cancer.  Prostate cancer.  Skin cancer.  Lung  cancer.  What should I know about heart disease, diabetes, and high blood pressure?  Blood pressure and heart disease  High blood pressure causes heart disease and increases the risk of stroke. This is more likely to develop in people who have high blood pressure readings or are overweight.  Talk with your health care provider about your target blood pressure readings.  Have your blood pressure checked:  Every 3-5 years if you are 24-52 years of age.  Every year if you are 3 years old or older.  If you are between the ages of 60 and 72 and are a current or former smoker, ask your health care provider if you should have a one-time screening for abdominal aortic aneurysm (AAA).  Diabetes  Have regular diabetes screenings. This checks your fasting blood sugar level. Have the screening done:  Once every three years after age 66 if you are at a normal weight and have a low risk for diabetes.  More often and at a younger age if you are overweight or have a high risk for diabetes.  What should I know about preventing infection?  Hepatitis B  If you have a higher risk for hepatitis B, you should be screened for this virus. Talk with your health care provider to find out if you are at risk for hepatitis B infection.  Hepatitis C  Blood testing is recommended for:  Everyone born from [birth year] through 1967.  Anyone  with known risk factors for hepatitis C.  Sexually transmitted infections (STIs)  You should be screened each year for STIs, including gonorrhea and chlamydia, if:  You are sexually active and are younger than 35 years of age.  You are older than 35 years of age and your health care provider tells you that you are at risk for this type of infection.  Your sexual activity has changed since you were last screened, and you are at increased risk for chlamydia or gonorrhea. Ask your health care provider if you are at risk.  Ask your health care provider about whether you are at high risk for HIV. Your health care provider  may recommend a prescription medicine to help prevent HIV infection. If you choose to take medicine to prevent HIV, you should first get tested for HIV. You should then be tested every 3 months for as long as you are taking the medicine.  Follow these instructions at home:  Alcohol use  Do not drink alcohol if your health care provider tells you not to drink.  If you drink alcohol:  Limit how much you have to 0-2 drinks a day.  Know how much alcohol is in your drink. In the [place] one drink equals one 12 oz bottle of beer (355 mL), one 5 oz glass of wine (148 mL), or one 1 oz glass of hard liquor (44 mL).  Lifestyle  Do not use any products that contain nicotine or tobacco. These products include cigarettes, chewing tobacco, and vaping devices, such as e-cigarettes. If you need help quitting, ask your health care provider.  Do not use street drugs.  Do not share needles.  Ask your health care provider for help if you need support or information about quitting drugs.  General instructions  Schedule regular health, dental, and eye exams.  Stay current with your vaccines.  Tell your health care provider if:  You often feel depressed.  You have ever been abused or do not feel safe at home.  Summary  Adopting a healthy lifestyle and getting preventive care are important in promoting health and wellness.  Follow your health care provider's instructions about healthy diet, exercising, and getting tested or screened for diseases.  Follow your health care provider's instructions on monitoring your cholesterol and blood pressure.  This information is not intended to replace advice given to you by your health care provider. Make sure you discuss any questions you have with your health care provider.  Document Revised: 12/19/2020 Document Reviewed: 12/19/2020  Elsevier Patient Education  2024 [employer].

## 2025-06-15 ENCOUNTER — Encounter: Payer: PRIVATE HEALTH INSURANCE | Admitting: Emergency Medicine
